# Patient Record
Sex: Male | Born: 1965 | Race: White | Hispanic: No | Marital: Married | Smoking: Former smoker
Health system: Southern US, Community
[De-identification: ages and names within clinical notes are randomized; demographics above are authoritative.]

## PROBLEM LIST (undated history)

## (undated) DIAGNOSIS — S060X9A Concussion with loss of consciousness of unspecified duration, initial encounter: Secondary | ICD-10-CM

## (undated) DIAGNOSIS — J189 Pneumonia, unspecified organism: Secondary | ICD-10-CM

## (undated) DIAGNOSIS — G4730 Sleep apnea, unspecified: Secondary | ICD-10-CM

## (undated) DIAGNOSIS — Z87442 Personal history of urinary calculi: Secondary | ICD-10-CM

## (undated) DIAGNOSIS — R519 Headache, unspecified: Secondary | ICD-10-CM

## (undated) DIAGNOSIS — G709 Myoneural disorder, unspecified: Secondary | ICD-10-CM

## (undated) DIAGNOSIS — E785 Hyperlipidemia, unspecified: Secondary | ICD-10-CM

## (undated) DIAGNOSIS — T7840XA Allergy, unspecified, initial encounter: Secondary | ICD-10-CM

## (undated) DIAGNOSIS — M1990 Unspecified osteoarthritis, unspecified site: Secondary | ICD-10-CM

## (undated) DIAGNOSIS — R51 Headache: Secondary | ICD-10-CM

## (undated) DIAGNOSIS — S060XAA Concussion with loss of consciousness status unknown, initial encounter: Secondary | ICD-10-CM

## (undated) HISTORY — PX: CHOLECYSTECTOMY: SHX55

## (undated) HISTORY — PX: KNEE SURGERY: SHX244

## (undated) HISTORY — DX: Allergy, unspecified, initial encounter: T78.40XA

## (undated) HISTORY — DX: Hyperlipidemia, unspecified: E78.5

## (undated) HISTORY — PX: KIDNEY STONE SURGERY: SHX686

## (undated) HISTORY — PX: BACK SURGERY: SHX140

---

## 2016-08-02 ENCOUNTER — Ambulatory Visit (INDEPENDENT_AMBULATORY_CARE_PROVIDER_SITE_OTHER): Payer: BLUE CROSS/BLUE SHIELD | Admitting: Orthopaedic Surgery

## 2016-08-02 ENCOUNTER — Ambulatory Visit (INDEPENDENT_AMBULATORY_CARE_PROVIDER_SITE_OTHER): Payer: Self-pay

## 2016-08-02 DIAGNOSIS — M1711 Unilateral primary osteoarthritis, right knee: Secondary | ICD-10-CM | POA: Diagnosis not present

## 2016-08-02 DIAGNOSIS — M25561 Pain in right knee: Secondary | ICD-10-CM

## 2016-08-02 DIAGNOSIS — G8929 Other chronic pain: Secondary | ICD-10-CM | POA: Diagnosis not present

## 2016-08-02 NOTE — Progress Notes (Signed)
Office Visit Note   Patient: Jeremy Vasquez           Date of Birth: 1965-08-07           MRN: [id number] Visit Date: 08/02/2016              Requested by: No referring provider defined for this encounter. PCP: No primary care provider on file.   Assessment & Plan: Visit Diagnoses:  1. Chronic pain of right knee   2. Unilateral primary osteoarthritis, right knee     Plan: I went over his x-rays in detail as well as a knee model as well as a knee replacement model. We talked about knee replacement surgery for him. This is a long and thorough discussion in this revolved around the anatomy of the knee as well as his weight and the function of his knee. His pain is 10 out of 10 and is detrimentally affects his activities daily living, his quality of life, and his mobility. He is tried and failed all forms of conservative treatment including a knee arthroscopy. He understands the risk of the surgery involves the risk of acute blood loss anemia, nerve and vessel injury, fracture, infection, and DVT. He understands all these are heightened including failure of the implants given his weight. I do feel the goals would be decreased pain, improve mobility, and improved quality of life. All questions were encouraged and answered. I do feel comfortable performing the surgery based on his physical exam of his knee. We will work on getting this scheduled in the near future. We'll see him back in 2 weeks postoperative but no x-rays of be needed.  Follow-Up Instructions: Return for 2 weeks post-op.   Orders:  Orders Placed This Encounter  Procedures  . XR Knee 1-2 Views Right   No orders of the defined types were placed in this encounter.     Procedures: No procedures performed   Clinical Data: No additional findings.   Subjective: No chief complaint on file. The patient is a very pleasant 51 year old gentleman comes in with chief complaint of right knee pain. He came to me for evaluation for  knee replacement surgery. He's been turned down by other laces due to a body mass index of greater than 40. However he said no one has really examined his knee and looked at his body habitus in general. At this point is right knee pain is daily. It is detrimentally affects his activities daily living, his quality of life, and his mobility. It is 10 out of 10. He has been to a physician in [place] a total knee needed a knee replacement as well as 1 at [facility] who said the same thing. He has already had an arthroscopic intervention of his right knee and is failed steroid injections as well as hyaluronic acid injection, NSAIDs, and activity modification.  HPI  Review of Systems He currently denies any chest pain, headache, shortness of breath, fever, chills, nausea, vomiting.  Objective: Vital Signs: There were no vitals taken for this visit.  Physical Exam He is alert and oriented 3 and in no acute distress Ortho Exam Examination of his right knee shows well-healed arthroscopic portal incisions. He has no significant effusion but he has a varus deformity that is easily correctable. His Lachman's is negative. His McMurray's is positive medial side. He has significant medial joint line tenderness as well as patellofemoral crepitation. His neck exam is normal, his lungs fluoroscopy relation bilaterally, his heart shows regular rate  and rhythm, his abdominal exam is benign. Of note his right knee has excellent range of motion but is quite painful. Specialty Comments:  No specialty comments available.  Imaging: Xr Knee 1-2 Views Right  Result Date: 08/02/2016 2 views of his right knee shows moderate to severe tricompartmental arthritic changes. There is a slight varus malalignment of the knee. There is significant para-articular osteophytes throughout the knee. This involves mainly the patellofemoral joint as well as the medial joint line. The medial joint line as close to bone on bone.    PMFS  History: There are no active problems to display for this patient.  No past medical history on file.  No family history on file.  No past surgical history on file. Social History   Occupational History  . Not on file.   Social History Main Topics  . Smoking status: Not on file  . Smokeless tobacco: Not on file  . Alcohol use Not on file  . Drug use: Unknown  . Sexual activity: Not on file

## 2016-09-04 NOTE — Progress Notes (Signed)
Need orders in epic.  Surgery on 4/20.  preop on 09/07/16.

## 2016-09-05 ENCOUNTER — Other Ambulatory Visit (INDEPENDENT_AMBULATORY_CARE_PROVIDER_SITE_OTHER): Payer: Self-pay | Admitting: Physician Assistant

## 2016-09-05 NOTE — Patient Instructions (Addendum)
Jeremy Vasquez  09/05/2016   Your procedure is scheduled on: 09/15/16  Report to [facility] Main  Entrance Take [facility]  elevators to 3rd floor to  [place] at     935 AM.     Call this number if you have problems the morning of surgery [phone]    Remember: ONLY 1 PERSON MAY GO WITH YOU TO SHORT STAY TO GET  READY MORNING OF Jeremy Vasquez.  Do not eat food or drink liquids :After Midnight.     Take these medicines the morning of surgery with A SIP OF WATER:  Gabapentin                                You may not have any metal on your body including hair pins and              piercings  Do not wear jewelry,lotions, powders or perfumes, deodorant                       Men may shave face and neck.   Do not bring valuables to the hospital. [place].  Contacts, dentures or bridgework may not be worn into surgery.  Leave suitcase in the car. After surgery it may be brought to your room.                Please read over the following fact sheets you were given: _____________________________________________________________________             [facility] - Preparing for Surgery Before surgery, you can play an important role.  Because skin is not sterile, your skin needs to be as free of germs as possible.  You can reduce the number of germs on your skin by washing with CHG (chlorahexidine gluconate) soap before surgery.  CHG is an antiseptic cleaner which kills germs and bonds with the skin to continue killing germs even after washing. Please DO NOT use if you have an allergy to CHG or antibacterial soaps.  If your skin becomes reddened/irritated stop using the CHG and inform your nurse when you arrive at Short Stay. Do not shave (including legs and underarms) for at least 48 hours prior to the first CHG shower.  You may shave your face/neck. Please follow these instructions carefully:  1.  Shower with CHG  Soap the night before surgery and the  morning of Surgery.  2.  If you choose to wash your hair, wash your hair first as usual with your  normal  shampoo.  3.  After you shampoo, rinse your hair and body thoroughly to remove the  shampoo.                           4.  Use CHG as you would any other liquid soap.  You can apply chg directly  to the skin and wash                       Gently with a scrungie or clean washcloth.  5.  Apply the CHG Soap to your body ONLY FROM THE NECK DOWN.   Do not use on  face/ open                           Wound or open sores. Avoid contact with eyes, ears mouth and genitals (private parts).                       Wash face,  Genitals (private parts) with your normal soap.             6.  Wash thoroughly, paying special attention to the area where your surgery  will be performed.  7.  Thoroughly rinse your body with warm water from the neck down.  8.  DO NOT shower/wash with your normal soap after using and rinsing off  the CHG Soap.                9.  Pat yourself dry with a clean towel.            10.  Wear clean pajamas.            11.  Place clean sheets on your bed the night of your first shower and do not  sleep with pets. Day of Surgery : Do not apply any lotions/deodorants the morning of surgery.  Please wear clean clothes to the hospital/surgery center.  FAILURE TO FOLLOW THESE INSTRUCTIONS MAY RESULT IN THE CANCELLATION OF YOUR SURGERY PATIENT SIGNATURE_________________________________  NURSE SIGNATURE__________________________________  ________________________________________________________________________   [name]  An incentive spirometer is a tool that can help keep your lungs clear and active. This tool measures how well you are filling your lungs with each breath. Taking long deep breaths may help reverse or decrease the chance of developing breathing (pulmonary) problems (especially infection) following:  A long period of time  when you are unable to move or be active. BEFORE THE PROCEDURE   If the spirometer includes an indicator to show your best effort, your nurse or respiratory therapist will set it to a desired goal.  If possible, sit up straight or lean slightly forward. Try not to slouch.  Hold the incentive spirometer in an upright position. INSTRUCTIONS FOR USE  1. Sit on the edge of your bed if possible, or sit up as far as you can in bed or on a chair. 2. Hold the incentive spirometer in an upright position. 3. Breathe out normally. 4. Place the mouthpiece in your mouth and seal your lips tightly around it. 5. Breathe in slowly and as deeply as possible, raising the piston or the ball toward the top of the column. 6. Hold your breath for 3-5 seconds or for as long as possible. Allow the piston or ball to fall to the bottom of the column. 7. Remove the mouthpiece from your mouth and breathe out normally. 8. Rest for a few seconds and repeat Steps 1 through 7 at least 10 times every 1-2 hours when you are awake. Take your time and take a few normal breaths between deep breaths. 9. The spirometer may include an indicator to show your best effort. Use the indicator as a goal to work toward during each repetition. 10. After each set of 10 deep breaths, practice coughing to be sure your lungs are clear. If you have an incision (the cut made at the time of surgery), support your incision when coughing by placing a pillow or rolled up towels firmly against it. Once you are able to get out of bed, walk around indoors  and cough well. You may stop using the incentive spirometer when instructed by your caregiver.  RISKS AND COMPLICATIONS  Take your time so you do not get dizzy or light-headed.  If you are in pain, you may need to take or ask for pain medication before doing incentive spirometry. It is harder to take a deep breath if you are having pain. AFTER USE  Rest and breathe slowly and easily.  It can be  helpful to keep track of a log of your progress. Your caregiver can provide you with a simple table to help with this. If you are using the spirometer at home, follow these instructions: [place] IF:   You are having difficultly using the spirometer.  You have trouble using the spirometer as often as instructed.  Your pain medication is not giving enough relief while using the spirometer.  You develop fever of 100.5 F (38.1 C) or higher. SEEK IMMEDIATE MEDICAL CARE IF:   You cough up bloody sputum that had not been present before.  You develop fever of 102 F (38.9 C) or greater.  You develop worsening pain at or near the incision site. MAKE SURE YOU:   Understand these instructions.  Will watch your condition.  Will get help right away if you are not doing well or get worse. Document Released: 09/25/2006 Document Revised: 08/07/2011 Document Reviewed: 11/26/2006 [facility] Patient Information 2014 [place], [place].   ________________________________________________________________________

## 2016-09-07 ENCOUNTER — Encounter (HOSPITAL_COMMUNITY): Payer: Self-pay

## 2016-09-07 ENCOUNTER — Encounter (HOSPITAL_COMMUNITY)
Admission: RE | Admit: 2016-09-07 | Discharge: 2016-09-07 | Disposition: A | Discharge status: Home / Self Care | Payer: BLUE CROSS/BLUE SHIELD | Source: Ambulatory Visit | Attending: Orthopaedic Surgery | Admitting: Orthopaedic Surgery

## 2016-09-07 DIAGNOSIS — M1711 Unilateral primary osteoarthritis, right knee: Secondary | ICD-10-CM | POA: Insufficient documentation

## 2016-09-07 DIAGNOSIS — Z01818 Encounter for other preprocedural examination: Secondary | ICD-10-CM | POA: Insufficient documentation

## 2016-09-07 HISTORY — DX: Concussion with loss of consciousness status unknown, initial encounter: S06.0XAA

## 2016-09-07 HISTORY — DX: Personal history of urinary calculi: Z87.442

## 2016-09-07 HISTORY — DX: Unspecified osteoarthritis, unspecified site: M19.90

## 2016-09-07 HISTORY — DX: Pneumonia, unspecified organism: J18.9

## 2016-09-07 HISTORY — DX: Headache, unspecified: R51.9

## 2016-09-07 HISTORY — DX: Myoneural disorder, unspecified: G70.9

## 2016-09-07 HISTORY — DX: Concussion with loss of consciousness of unspecified duration, initial encounter: S06.0X9A

## 2016-09-07 HISTORY — DX: Sleep apnea, unspecified: G47.30

## 2016-09-07 HISTORY — DX: Headache: R51

## 2016-09-07 LAB — CBC
HCT: 44 % (ref 39.0–52.0)
Hemoglobin: 14.4 g/dL (ref 13.0–17.0)
MCH: 27.6 pg (ref 26.0–34.0)
MCHC: 32.7 g/dL (ref 30.0–36.0)
MCV: 84.3 fL (ref 78.0–100.0)
PLATELETS: 200 10*3/uL (ref 150–400)
RBC: 5.22 MIL/uL (ref 4.22–5.81)
RDW: 14.6 % (ref 11.5–15.5)
WBC: 6.9 10*3/uL (ref 4.0–10.5)

## 2016-09-07 LAB — BASIC METABOLIC PANEL
Anion gap: 7 (ref 5–15)
BUN: 22 mg/dL — AB (ref 6–20)
CHLORIDE: 105 mmol/L (ref 101–111)
CO2: 26 mmol/L (ref 22–32)
CREATININE: 1 mg/dL (ref 0.61–1.24)
Calcium: 9 mg/dL (ref 8.9–10.3)
GFR calc Af Amer: >60 mL/min (ref >60)
Glucose, Bld: 112 mg/dL — ABNORMAL HIGH (ref 65–99)
Potassium: 4.7 mmol/L (ref 3.5–5.1)
SODIUM: 138 mmol/L (ref 135–145)

## 2016-09-07 LAB — SURGICAL PCR SCREEN
MRSA, PCR: NEGATIVE
STAPHYLOCOCCUS AUREUS: NEGATIVE

## 2016-09-11 ENCOUNTER — Other Ambulatory Visit (INDEPENDENT_AMBULATORY_CARE_PROVIDER_SITE_OTHER): Payer: Self-pay | Admitting: Orthopaedic Surgery

## 2016-09-14 MED ORDER — CEFAZOLIN SODIUM 10 G IJ SOLR
3.0000 g | INTRAMUSCULAR | Status: AC
  Administered 2016-09-15: 3 g via INTRAVENOUS
  Filled 2016-09-14: qty 3

## 2016-09-15 ENCOUNTER — Inpatient Hospital Stay (HOSPITAL_COMMUNITY): Payer: BLUE CROSS/BLUE SHIELD | Admitting: Registered Nurse

## 2016-09-15 ENCOUNTER — Inpatient Hospital Stay (HOSPITAL_COMMUNITY): Payer: BLUE CROSS/BLUE SHIELD

## 2016-09-15 ENCOUNTER — Inpatient Hospital Stay (HOSPITAL_COMMUNITY)
Admission: RE | Admit: 2016-09-15 | Discharge: 2016-09-17 | DRG: 470 | Disposition: A | Discharge status: Home Health | Payer: BLUE CROSS/BLUE SHIELD | Source: Ambulatory Visit | Attending: Orthopaedic Surgery | Admitting: Orthopaedic Surgery

## 2016-09-15 ENCOUNTER — Encounter (HOSPITAL_COMMUNITY)
Admission: RE | Disposition: A | Discharge status: Home Health | Payer: Self-pay | Source: Ambulatory Visit | Attending: Orthopaedic Surgery

## 2016-09-15 ENCOUNTER — Encounter (HOSPITAL_COMMUNITY): Payer: Self-pay

## 2016-09-15 DIAGNOSIS — E669 Obesity, unspecified: Secondary | ICD-10-CM | POA: Diagnosis present

## 2016-09-15 DIAGNOSIS — M1711 Unilateral primary osteoarthritis, right knee: Secondary | ICD-10-CM | POA: Diagnosis present

## 2016-09-15 DIAGNOSIS — Z791 Long term (current) use of non-steroidal anti-inflammatories (NSAID): Secondary | ICD-10-CM

## 2016-09-15 DIAGNOSIS — Z6841 Body Mass Index (BMI) 40.0 and over, adult: Secondary | ICD-10-CM

## 2016-09-15 DIAGNOSIS — Z87891 Personal history of nicotine dependence: Secondary | ICD-10-CM

## 2016-09-15 DIAGNOSIS — Z96651 Presence of right artificial knee joint: Secondary | ICD-10-CM

## 2016-09-15 DIAGNOSIS — G4730 Sleep apnea, unspecified: Secondary | ICD-10-CM | POA: Diagnosis present

## 2016-09-15 HISTORY — PX: TOTAL KNEE ARTHROPLASTY: SHX125

## 2016-09-15 SURGERY — ARTHROPLASTY, KNEE, TOTAL
Anesthesia: General | Site: Knee | Laterality: Right

## 2016-09-15 MED ORDER — OXYCODONE HCL 5 MG PO TABS: 5.0000 mg | ORAL_TABLET | Freq: Once | ORAL | Status: DC | PRN

## 2016-09-15 MED ORDER — LIDOCAINE 2% (20 MG/ML) 5 ML SYRINGE
INTRAMUSCULAR | Status: AC
  Filled 2016-09-15: qty 10

## 2016-09-15 MED ORDER — BUPIVACAINE-EPINEPHRINE (PF) 0.5% -1:200000 IJ SOLN
INTRAMUSCULAR | Status: DC | PRN
  Administered 2016-09-15: 25 mL via PERINEURAL

## 2016-09-15 MED ORDER — SUFENTANIL CITRATE 50 MCG/ML IV SOLN
INTRAVENOUS | Status: AC
  Filled 2016-09-15: qty 1

## 2016-09-15 MED ORDER — ONDANSETRON HCL 4 MG/2ML IJ SOLN
INTRAMUSCULAR | Status: AC
  Filled 2016-09-15: qty 2

## 2016-09-15 MED ORDER — SUGAMMADEX SODIUM 500 MG/5ML IV SOLN
INTRAVENOUS | Status: AC
  Filled 2016-09-15: qty 5

## 2016-09-15 MED ORDER — DEXAMETHASONE SODIUM PHOSPHATE 10 MG/ML IJ SOLN
INTRAMUSCULAR | Status: DC | PRN
  Administered 2016-09-15: 10 mg via INTRAVENOUS

## 2016-09-15 MED ORDER — SUGAMMADEX SODIUM 500 MG/5ML IV SOLN
INTRAVENOUS | Status: DC | PRN
  Administered 2016-09-15: 300 mg via INTRAVENOUS

## 2016-09-15 MED ORDER — DIPHENHYDRAMINE HCL 12.5 MG/5ML PO ELIX
12.5000 mg | ORAL_SOLUTION | ORAL | Status: DC | PRN
  Administered 2016-09-17: 12.5 mg via ORAL
  Filled 2016-09-15: qty 5

## 2016-09-15 MED ORDER — CEFAZOLIN SODIUM-DEXTROSE 2-4 GM/100ML-% IV SOLN
2.0000 g | Freq: Four times a day (QID) | INTRAVENOUS | Status: AC
  Administered 2016-09-15 (×2): 2 g via INTRAVENOUS
  Filled 2016-09-15 (×2): qty 100

## 2016-09-15 MED ORDER — ONDANSETRON HCL 4 MG PO TABS: 4.0000 mg | ORAL_TABLET | Freq: Four times a day (QID) | ORAL | Status: DC | PRN

## 2016-09-15 MED ORDER — ACETAMINOPHEN 10 MG/ML IV SOLN
INTRAVENOUS | Status: DC | PRN
  Administered 2016-09-15: 1000 mg via INTRAVENOUS

## 2016-09-15 MED ORDER — METOCLOPRAMIDE HCL 5 MG/ML IJ SOLN: 5.0000 mg | Freq: Three times a day (TID) | INTRAMUSCULAR | Status: DC | PRN

## 2016-09-15 MED ORDER — METOCLOPRAMIDE HCL 5 MG PO TABS
5.0000 mg | ORAL_TABLET | Freq: Three times a day (TID) | ORAL | Status: DC | PRN
Start: 2016-09-15 — End: 2016-09-17

## 2016-09-15 MED ORDER — METHOCARBAMOL 1000 MG/10ML IJ SOLN
500.0000 mg | Freq: Four times a day (QID) | INTRAVENOUS | Status: DC | PRN
  Administered 2016-09-15: 500 mg via INTRAVENOUS
  Filled 2016-09-15: qty 550

## 2016-09-15 MED ORDER — SODIUM CHLORIDE 0.9 % IV SOLN
INTRAVENOUS | Status: DC
  Administered 2016-09-15 – 2016-09-16 (×2): via INTRAVENOUS

## 2016-09-15 MED ORDER — PHENYLEPHRINE 40 MCG/ML (10ML) SYRINGE FOR IV PUSH (FOR BLOOD PRESSURE SUPPORT)
PREFILLED_SYRINGE | INTRAVENOUS | Status: AC
  Filled 2016-09-15: qty 20

## 2016-09-15 MED ORDER — SUCCINYLCHOLINE CHLORIDE 200 MG/10ML IV SOSY
PREFILLED_SYRINGE | INTRAVENOUS | Status: AC
  Filled 2016-09-15: qty 10

## 2016-09-15 MED ORDER — PHENYLEPHRINE HCL 10 MG/ML IJ SOLN
INTRAMUSCULAR | Status: DC | PRN
  Administered 2016-09-15: 80 ug via INTRAVENOUS
  Administered 2016-09-15 (×3): 120 ug via INTRAVENOUS

## 2016-09-15 MED ORDER — ONDANSETRON HCL 4 MG/2ML IJ SOLN
INTRAMUSCULAR | Status: DC | PRN
  Administered 2016-09-15: 4 mg via INTRAVENOUS

## 2016-09-15 MED ORDER — FENTANYL CITRATE (PF) 100 MCG/2ML IJ SOLN
INTRAMUSCULAR | Status: AC
  Filled 2016-09-15: qty 2

## 2016-09-15 MED ORDER — EPHEDRINE 5 MG/ML INJ
INTRAVENOUS | Status: AC
  Filled 2016-09-15: qty 10

## 2016-09-15 MED ORDER — SODIUM CHLORIDE 0.9 % IR SOLN
Status: DC | PRN
  Administered 2016-09-15: 1000 mL

## 2016-09-15 MED ORDER — LIDOCAINE HCL (CARDIAC) 20 MG/ML IV SOLN
INTRAVENOUS | Status: DC | PRN
  Administered 2016-09-15: 25 mg via INTRATRACHEAL
  Administered 2016-09-15: 100 mg via INTRATRACHEAL

## 2016-09-15 MED ORDER — MENTHOL 3 MG MT LOZG: 1.0000 | LOZENGE | OROMUCOSAL | Status: DC | PRN

## 2016-09-15 MED ORDER — HYDROMORPHONE HCL 2 MG/ML IJ SOLN
0.2500 mg | INTRAMUSCULAR | Status: DC | PRN
  Administered 2016-09-15: 0.3 mg via INTRAVENOUS

## 2016-09-15 MED ORDER — PROPOFOL 10 MG/ML IV BOLUS
INTRAVENOUS | Status: DC | PRN
  Administered 2016-09-15: 250 mg via INTRAVENOUS

## 2016-09-15 MED ORDER — ONDANSETRON HCL 4 MG/2ML IJ SOLN: 4.0000 mg | Freq: Four times a day (QID) | INTRAMUSCULAR | Status: DC | PRN

## 2016-09-15 MED ORDER — CHLORHEXIDINE GLUCONATE 4 % EX LIQD: 60.0000 mL | Freq: Once | CUTANEOUS | Status: DC

## 2016-09-15 MED ORDER — LACTATED RINGERS IV SOLN
INTRAVENOUS | Status: DC | PRN
  Administered 2016-09-15 (×3): via INTRAVENOUS

## 2016-09-15 MED ORDER — SODIUM CHLORIDE 0.9 % IR SOLN
Status: DC | PRN
  Administered 2016-09-15: 2000 mL

## 2016-09-15 MED ORDER — EPHEDRINE SULFATE 50 MG/ML IJ SOLN
INTRAMUSCULAR | Status: DC | PRN
  Administered 2016-09-15: 5 mg via INTRAVENOUS

## 2016-09-15 MED ORDER — POLYETHYLENE GLYCOL 3350 17 G PO PACK: 17.0000 g | PACK | Freq: Every day | ORAL | Status: DC | PRN

## 2016-09-15 MED ORDER — MIDAZOLAM HCL 2 MG/2ML IJ SOLN
INTRAMUSCULAR | Status: AC
  Filled 2016-09-15: qty 2

## 2016-09-15 MED ORDER — MIDAZOLAM HCL 5 MG/5ML IJ SOLN
INTRAMUSCULAR | Status: DC | PRN
  Administered 2016-09-15: 2 mg via INTRAVENOUS

## 2016-09-15 MED ORDER — OXYCODONE HCL 5 MG/5ML PO SOLN
5.0000 mg | Freq: Once | ORAL | Status: DC | PRN
  Filled 2016-09-15: qty 5

## 2016-09-15 MED ORDER — HYDROMORPHONE HCL 1 MG/ML IJ SOLN
INTRAMUSCULAR | Status: DC | PRN
  Administered 2016-09-15 (×2): 1 mg via INTRAVENOUS

## 2016-09-15 MED ORDER — PROPOFOL 10 MG/ML IV BOLUS
INTRAVENOUS | Status: AC
  Filled 2016-09-15: qty 40

## 2016-09-15 MED ORDER — HYDROMORPHONE HCL 2 MG/ML IJ SOLN: 1.0000 mg | INTRAMUSCULAR | Status: DC | PRN

## 2016-09-15 MED ORDER — RIVAROXABAN 10 MG PO TABS
10.0000 mg | ORAL_TABLET | Freq: Every day | ORAL | Status: DC
  Administered 2016-09-16 – 2016-09-17 (×2): 10 mg via ORAL
  Filled 2016-09-15 (×2): qty 1

## 2016-09-15 MED ORDER — DEXAMETHASONE SODIUM PHOSPHATE 10 MG/ML IJ SOLN
INTRAMUSCULAR | Status: AC
  Filled 2016-09-15: qty 1

## 2016-09-15 MED ORDER — ALUM & MAG HYDROXIDE-SIMETH 200-200-20 MG/5ML PO SUSP: 30.0000 mL | ORAL | Status: DC | PRN

## 2016-09-15 MED ORDER — OXYCODONE HCL 5 MG PO TABS
5.0000 mg | ORAL_TABLET | ORAL | Status: DC | PRN
  Administered 2016-09-15 – 2016-09-17 (×5): 10 mg via ORAL
  Filled 2016-09-15 (×5): qty 2

## 2016-09-15 MED ORDER — HYDROMORPHONE HCL 2 MG/ML IJ SOLN
INTRAMUSCULAR | Status: AC
  Filled 2016-09-15: qty 1

## 2016-09-15 MED ORDER — PHENOL 1.4 % MT LIQD
1.0000 | OROMUCOSAL | Status: DC | PRN
  Filled 2016-09-15: qty 177

## 2016-09-15 MED ORDER — ROCURONIUM BROMIDE 50 MG/5ML IV SOSY
PREFILLED_SYRINGE | INTRAVENOUS | Status: AC
  Filled 2016-09-15: qty 5

## 2016-09-15 MED ORDER — ACETAMINOPHEN 10 MG/ML IV SOLN
INTRAVENOUS | Status: AC
  Filled 2016-09-15: qty 100

## 2016-09-15 MED ORDER — FENTANYL CITRATE (PF) 100 MCG/2ML IJ SOLN
INTRAMUSCULAR | Status: DC | PRN
  Administered 2016-09-15: 100 ug via INTRAVENOUS

## 2016-09-15 MED ORDER — STERILE WATER FOR IRRIGATION IR SOLN
Status: DC | PRN
  Administered 2016-09-15: 2000 mL

## 2016-09-15 MED ORDER — ACETAMINOPHEN 325 MG PO TABS
650.0000 mg | ORAL_TABLET | Freq: Four times a day (QID) | ORAL | Status: DC | PRN
  Administered 2016-09-16: 650 mg via ORAL
  Filled 2016-09-15: qty 2

## 2016-09-15 MED ORDER — DOCUSATE SODIUM 100 MG PO CAPS
100.0000 mg | ORAL_CAPSULE | Freq: Two times a day (BID) | ORAL | Status: DC
  Administered 2016-09-16 – 2016-09-17 (×4): 100 mg via ORAL
  Filled 2016-09-15 (×4): qty 1

## 2016-09-15 MED ORDER — ACETAMINOPHEN 650 MG RE SUPP: 650.0000 mg | Freq: Four times a day (QID) | RECTAL | Status: DC | PRN

## 2016-09-15 MED ORDER — METHOCARBAMOL 500 MG PO TABS
500.0000 mg | ORAL_TABLET | Freq: Four times a day (QID) | ORAL | Status: DC | PRN
  Administered 2016-09-16 – 2016-09-17 (×3): 500 mg via ORAL
  Filled 2016-09-15 (×3): qty 1

## 2016-09-15 MED ORDER — SUFENTANIL CITRATE 50 MCG/ML IV SOLN
INTRAVENOUS | Status: DC | PRN
  Administered 2016-09-15: 5 ug via INTRAVENOUS
  Administered 2016-09-15: 15 ug via INTRAVENOUS
  Administered 2016-09-15: 10 ug via INTRAVENOUS
  Administered 2016-09-15 (×2): 15 ug via INTRAVENOUS

## 2016-09-15 MED ORDER — SODIUM CHLORIDE 0.9 % IV SOLN
1000.0000 mg | INTRAVENOUS | Status: AC
  Administered 2016-09-15: 1000 mg via INTRAVENOUS
  Filled 2016-09-15: qty 1100

## 2016-09-15 MED ORDER — KETOROLAC TROMETHAMINE 15 MG/ML IJ SOLN
15.0000 mg | Freq: Four times a day (QID) | INTRAMUSCULAR | Status: AC
  Administered 2016-09-15 – 2016-09-16 (×4): 15 mg via INTRAVENOUS
  Filled 2016-09-15 (×4): qty 1

## 2016-09-15 MED ORDER — GABAPENTIN 300 MG PO CAPS
300.0000 mg | ORAL_CAPSULE | Freq: Two times a day (BID) | ORAL | Status: DC
  Administered 2016-09-16 – 2016-09-17 (×4): 300 mg via ORAL
  Filled 2016-09-15 (×4): qty 1

## 2016-09-15 SURGICAL SUPPLY — 44 items
BAG ZIPLOCK 12X15 (MISCELLANEOUS) ×2 IMPLANT
BANDAGE ACE 6X5 VEL STRL LF (GAUZE/BANDAGES/DRESSINGS) ×2 IMPLANT
BENZOIN TINCTURE PRP APPL 2/3 (GAUZE/BANDAGES/DRESSINGS) ×2 IMPLANT
BLADE SAG 18X100X1.27 (BLADE) ×2 IMPLANT
BOWL SMART MIX CTS (DISPOSABLE) ×2 IMPLANT
CAPT KNEE TOTAL 3 ×2 IMPLANT
CEMENT BONE SIMPLEX SPEEDSET (Cement) ×4 IMPLANT
COVER SURGICAL LIGHT HANDLE (MISCELLANEOUS) ×2 IMPLANT
CUFF TOURN SGL QUICK 44 (TOURNIQUET CUFF) ×2 IMPLANT
DRAPE U-SHAPE 47X51 STRL (DRAPES) ×2 IMPLANT
DRSG PAD ABDOMINAL 8X10 ST (GAUZE/BANDAGES/DRESSINGS) ×2 IMPLANT
DURAPREP 26ML APPLICATOR (WOUND CARE) ×2 IMPLANT
ELECT REM PT RETURN 15FT ADLT (MISCELLANEOUS) ×2 IMPLANT
GAUZE SPONGE 4X4 12PLY STRL (GAUZE/BANDAGES/DRESSINGS) ×2 IMPLANT
GLOVE BIO SURGEON STRL SZ7.5 (GLOVE) ×2 IMPLANT
GLOVE BIOGEL PI IND STRL 7.5 (GLOVE) ×5 IMPLANT
GLOVE BIOGEL PI IND STRL 8 (GLOVE) ×2 IMPLANT
GLOVE BIOGEL PI INDICATOR 7.5 (GLOVE) ×5
GLOVE BIOGEL PI INDICATOR 8 (GLOVE) ×2
GLOVE ECLIPSE 8.0 STRL XLNG CF (GLOVE) ×2 IMPLANT
GLOVE SURG SS PI 7.5 STRL IVOR (GLOVE) ×2 IMPLANT
GOWN STRL REUS W/ TWL XL LVL3 (GOWN DISPOSABLE) ×1 IMPLANT
GOWN STRL REUS W/TWL XL LVL3 (GOWN DISPOSABLE) ×7 IMPLANT
HANDPIECE INTERPULSE COAX TIP (DISPOSABLE) ×1
IMMOBILIZER KNEE 20 (SOFTGOODS) ×4
IMMOBILIZER KNEE 20 THIGH 36 (SOFTGOODS) ×2 IMPLANT
PACK TOTAL KNEE CUSTOM (KITS) ×2 IMPLANT
PAD ABD 8X10 STRL (GAUZE/BANDAGES/DRESSINGS) ×2 IMPLANT
PADDING CAST COTTON 6X4 STRL (CAST SUPPLIES) ×4 IMPLANT
POSITIONER SURGICAL ARM (MISCELLANEOUS) ×2 IMPLANT
SET HNDPC FAN SPRY TIP SCT (DISPOSABLE) ×1 IMPLANT
SET PAD KNEE POSITIONER (MISCELLANEOUS) ×2 IMPLANT
STAPLER VISISTAT 35W (STAPLE) IMPLANT
STRIP CLOSURE SKIN 1/2X4 (GAUZE/BANDAGES/DRESSINGS) ×4 IMPLANT
SUT MNCRL AB 4-0 PS2 18 (SUTURE) IMPLANT
SUT VIC AB 0 CT1 27 (SUTURE) ×1
SUT VIC AB 0 CT1 27XBRD ANTBC (SUTURE) ×1 IMPLANT
SUT VIC AB 1 CT1 27 (SUTURE) ×2
SUT VIC AB 1 CT1 27XBRD ANTBC (SUTURE) ×2 IMPLANT
SUT VIC AB 2-0 CT1 27 (SUTURE) ×2
SUT VIC AB 2-0 CT1 TAPERPNT 27 (SUTURE) ×2 IMPLANT
WRAP KNEE MAXI GEL POST OP (GAUZE/BANDAGES/DRESSINGS) ×2 IMPLANT
YANKAUER SUCT BULB TIP 10FT TU (MISCELLANEOUS) ×2 IMPLANT
YANKAUER SUCT BULB TIP NO VENT (SUCTIONS) ×2 IMPLANT

## 2016-09-15 NOTE — Anesthesia Procedure Notes (Signed)
Anesthesia Regional Block: Adductor canal block   Pre-Anesthetic Checklist: ,, timeout performed, Correct Patient, Correct Site, Correct Laterality, Correct Procedure, Correct Position, site marked, Risks and benefits discussed,  Surgical consent,  Pre-op evaluation,  At surgeon's request and post-op pain management  Laterality: Right  Prep: chloraprep       Needles:  Injection technique: Single-shot  Needle Type: Echogenic Needle     Needle Length: 9cm  Needle Gauge: 21     Additional Needles:   Procedures: ultrasound guided,,,,,,,,  Narrative:  Start time: 09/15/2016 7:09 AM End time: 09/15/2016 7:16 AM Injection made incrementally with aspirations every 5 mL.  Performed by: Personally  Anesthesiologist: Jimma Ortman  Additional Notes: Pt tolerated the procedure well.

## 2016-09-15 NOTE — Progress Notes (Signed)
Pt states that he will self administer CPAP when ready for bed.  RT to monitor and assess as needed.  

## 2016-09-15 NOTE — Anesthesia Procedure Notes (Addendum)
Procedure Name: Intubation Date/Time: 09/15/2016 7:43 AM Performed by: Lissa Morales Pre-anesthesia Checklist: Patient identified, Emergency Drugs available, Suction available and Patient being monitored Patient Re-evaluated:Patient Re-evaluated prior to inductionOxygen Delivery Method: Circle system utilized Preoxygenation: Pre-oxygenation with 100% oxygen Intubation Type: IV induction Ventilation: Mask ventilation without difficulty Laryngoscope Size: Glidescope, Mac and 4 Grade View: Grade IV Tube type: Oral Tube size: 8.0 mm Number of attempts: 1 Airway Equipment and Method: Stylet and Oral airway Placement Confirmation: ETT inserted through vocal cords under direct vision,  positive ETCO2 and breath sounds checked- equal and bilateral Secured at: 23 cm Tube secured with: Tape Dental Injury: Teeth and Oropharynx as per pre-operative assessment  Difficulty Due To: Difficulty was anticipated, Difficult Airway- due to large tongue, Difficult Airway- due to reduced neck mobility and Difficult Airway- due to limited oral opening Comments: Pt obese and lots of redundant tissue, small mouth see above.  Biltateral breath sounds and +ETCO2. MAP 4**
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
## 2016-09-15 NOTE — Op Note (Signed)
NAME:  Jeremy Vasquez, Jeremy Vasquez NO.:  [id number]  MEDICAL RECORD NO.:  [id number]  LOCATION:                               FACILITY:  [facility]  PHYSICIAN:  Lind Guest. Ninfa Linden, M.D.DATE OF BIRTH:  May 28, 1966  DATE OF PROCEDURE:  09/15/2016 DATE OF DISCHARGE:                              OPERATIVE REPORT   PREOPERATIVE DIAGNOSIS:  Severe osteoarthritis and degenerative joint disease, right knee.  POSTOPERATIVE DIAGNOSIS:  Severe osteoarthritis and degenerative joint disease, right knee.  PROCEDURE:  Right total knee arthroplasty.  IMPLANTS:  Stryker Triathlon knee with size 5 femoral component, size 5 tibial tray with universal base plate, size 11 mm thickness polyethylene insert, size 35 patellar button.  SURGEON:  Lind Guest. Ninfa Linden, M.D.  ASSISTANT:  Erskine Emery, PA-C.  ANESTHESIA: 1. Right lower extremity adductor canal block. 2. General.  TOURNIQUET TIME:  Just over 1 hour.  BLOOD LOSS:  Less than 100 mL.  COMPLICATIONS:  None.  ANTIBIOTICS:  IV Ancef 3 g.  INDICATIONS:  Mr. Peddie is a 51 year old gentleman well known to me.  He has a history of a right knee arthroscopy with a partial medial meniscectomy.  Over the years, his arthritis has worsened and his knee pain is worsened in his right knee.  He now has x-ray showing significant cartilage loss throughout the knee.  His pain is daily and it has detrimentally affected his activities of daily living, his mobility, and his quality of life.  At this point, given the failure of conservative treatment, he wished to proceed with a total knee arthroplasty.  He understands the risk of acute blood loss anemia, nerve and vessel injury, fracture, infection, and DVT.  He understands the goals of decreased pain, improved mobility, and overall improved quality of life.  PROCEDURE DESCRIPTION:  After informed consent was obtained, appropriate right knee was marked.  Anesthesia obtained an adductor canal  block.  He was brought to the operating room, placed supine on the operating table. General anesthesia was then obtained.  A nonsterile tourniquet was placed around his upper right thigh, and his right leg was prepped and draped from the thigh down the ankle with DuraPrep and sterile drapes and a sterile stockinette.  A time-out was called.  He identified correct patient and correct right knee.  We then used an Esmarch to wrap out the leg and tourniquet was inflated to 300 mmHg.  We then made a direct midline incision over the patella and carried this proximally and distally.  We dissected down the knee joint, carried out a medial parapatellar arthrotomy finding a large joint effusion and finding significant arthritis throughout the knee.  With the knee in a flexed position using the extramedullary cutting guide, we set this for taking 9 mm off the high side of the tibia correcting varus, valgus, and neutral slope.  We made our tibia cut without difficulty.  We then went to the femur and set our distal femoral cutting guide for 5 degrees, externally rotated for right knee setting our distal femoral cutting guide for 8 mm distal femoral cut.  We made this cut without difficulty and brought the knee back down to  full extension with a 9 mm extension block and achieved its full extension.  We went back to the femur and cleaned more soft tissue from the knee including more remnants of the ACL, PCL, medial and lateral meniscus.  We then put our femoral sizing guide based off the epicondylar axis and Whiteside's line, also based on 3 degrees externally rotated.  We made our sizing decision after using a size 5 femur.  We then put our 4-in-1 cutting block for a size 5 femur, made our anterior and posterior cuts, followed by our chamfer cuts.  We then made our femoral box cut.  We then went back to the tibia and set our tibial rotation off the tibial tubercle and the femur.  We chose a size 5  tibial tray, so once we secured this, we drilled for universal base plate and did our keel punch.  With a trial 5 tibia in place, followed by the 5 femur, we tried a 9 mm and 11 mm fix-bearing polyethylene insert and we were pleased with 11 mm stability and range of motion.  We then made our patellar cut and drilled 3 holes for patellar button.  We then removed all trial instrumentation and irrigated the knee with normal saline solution.  We then mixed our cement and cemented the real Stryker Triathlon tibial tray with universal base plate size 5, followed by the size 5 femur.  We placed the 11 mm fix-bearing polyethylene insert and cemented our patellar button.  Once the cement had hardened, we removed cement debris from the knee and then irrigated the knee with normal saline solution.  The tourniquet was let down and hemostasis was obtained with electrocautery. We then closed the arthrotomy with interrupted #1 Vicryl suture, followed by 0 Vicryl in the deep tissue, 2-0 Vicryl in the subcutaneous tissue, 4-0 Monocryl subcuticular stitch, and Steri-Strips on the skin. Well-padded sterile dressing was applied.  He was awakened, extubated, and taken to recovery room in stable condition.  All final counts were correct.  There were no complications noted.  Of note, Erskine Emery, PA-C assisted in the entire case.  His assistance was crucial for facilitating all aspects of this case.     Lind Guest. Ninfa Linden, M.D.   ______________________________ Lind Guest. Ninfa Linden, M.D.    CYB/MEDQ  D:  09/15/2016  T:  09/15/2016  Job:  [id number]

## 2016-09-15 NOTE — Transfer of Care (Signed)
Immediate Anesthesia Transfer of Care Note  Patient: Jeremy Vasquez  Procedure(s) Performed: Procedure(s) with comments: RIGHT TOTAL KNEE ARTHROPLASTY (Right) - Adductor Block  Patient Location: PACU  Anesthesia Type:General  Level of Consciousness: awake, alert , oriented and patient cooperative  Airway & Oxygen Therapy: Patient Spontanous Breathing and Patient connected to face mask oxygen  Post-op Assessment: Report given to RN, Post -op Vital signs reviewed and stable and Patient moving all extremities X 4  Post vital signs: stable  Last Vitals:  Vitals:   09/15/16 0518 09/15/16 0945  BP: 133/83 (!) (P) 162/79  Pulse: 79 (P) 88  Resp: 16 (!) (P) 28  Temp: 36.4 C (P) 36.5 C    Last Pain:  Vitals:   09/15/16 0549  TempSrc:   PainSc: 2       Patients Stated Pain Goal: 2 (99/23/41 4436)  Complications: No apparent anesthesia complications

## 2016-09-15 NOTE — Evaluation (Signed)
Physical Therapy Evaluation Patient Details Name: Jeremy Vasquez MRN: [id number] DOB: 1965-11-16 Today's Date: 09/15/2016   History of Present Illness  Pt s/p R TKR and with hx of multiple back surgeries  Clinical Impression  Pt s/p R TKR and presents with decreased R LE strength/ROM and post op pain limiting functional mobility.  Pt should progress to dc home with family assist and HHPT follow up.    Follow Up Recommendations Home health PT    Equipment Recommendations  Rolling walker with 5" wheels (Pt is 45ft tall and 330+ lbs)    Recommendations for Other Services       Precautions / Restrictions Precautions Precautions: Fall;Knee Required Braces or Orthoses: Knee Immobilizer - Right Knee Immobilizer - Right: Discontinue once straight leg raise with < 10 degree lag Restrictions Weight Bearing Restrictions: No Other Position/Activity Restrictions: WBAT      Mobility  Bed Mobility Overal bed mobility: Needs Assistance Bed Mobility: Supine to Sit     Supine to sit: Min assist     General bed mobility comments: cues for sequence and use of L LE to self assist.  Physical assist with R LE  Transfers Overall transfer level: Needs assistance Equipment used: Rolling walker (2 wheeled) Transfers: Sit to/from Stand Sit to Stand: Min assist;From elevated surface         General transfer comment: cues for LE management and use of UEs to self assist  Ambulation/Gait Ambulation/Gait assistance: Min assist Ambulation Distance (Feet): 14 Feet Assistive device: Rolling walker (2 wheeled) Gait Pattern/deviations: Step-to pattern;Decreased step length - right;Decreased step length - left;Shuffle;Trunk flexed Gait velocity: decr   General Gait Details: cues for sequence, posture and position from [employer]            Wheelchair Mobility    Modified Rankin (Stroke Patients Only)       Balance                                              Pertinent Vitals/Pain Pain Assessment: Faces Faces Pain Scale: Hurts even more Pain Location: R knee with WB or movement Pain Descriptors / Indicators: Aching;Sore Pain Intervention(s): Limited activity within patient's tolerance;Monitored during session;Premedicated before session;Ice applied    Home Living Family/patient expects to be discharged to:: Private residence Living Arrangements: Spouse/significant other;Children Available Help at Discharge: Family Type of Home: House Home Access: Stairs to enter   Technical brewer of Steps: 1 Home Layout: One level Home Equipment: Crutches      Prior Function Level of Independence: Independent;Independent with assistive device(s)   Gait / Transfers Assistance Needed: crutches as needed           Hand Dominance        Extremity/Trunk Assessment   Upper Extremity Assessment Upper Extremity Assessment: Overall WFL for tasks assessed    Lower Extremity Assessment Lower Extremity Assessment: RLE deficits/detail       Communication   Communication: No difficulties  Cognition Arousal/Alertness: Awake/alert Behavior During Therapy: WFL for tasks assessed/performed Overall Cognitive Status: Within Functional Limits for tasks assessed                                        General Comments      Exercises     Assessment/Plan  PT Assessment Patient needs continued PT services  PT Problem List Decreased strength;Decreased range of motion;Decreased activity tolerance;Decreased mobility;Decreased knowledge of use of DME;Obesity;Pain       PT Treatment Interventions DME instruction;Gait training;Stair training;Functional mobility training;Therapeutic exercise;Therapeutic activities;Patient/family education    PT Goals (Current goals can be found in the Care Plan section)  Acute Rehab PT Goals Patient Stated Goal: go fishing PT Goal Formulation: With patient Time For Goal Achievement:  09/19/16 Potential to Achieve Goals: Good    Frequency 7X/week   Barriers to discharge        Co-evaluation               End of Session Equipment Utilized During Treatment: Right knee immobilizer Activity Tolerance: Patient tolerated treatment well Patient left: in chair;with call bell/phone within reach;with family/visitor present Nurse Communication: Mobility status PT Visit Diagnosis: Unsteadiness on feet (R26.81);Difficulty in walking, not elsewhere classified (R26.2)    Time: 7195-9747 PT Time Calculation (min) (ACUTE ONLY): 45 min   Charges:   PT Evaluation $PT Eval Low Complexity: 1 Procedure PT Treatments $Gait Training: 8-22 mins   PT G Codes:        Pg [phone]   Jeremy Vasquez 09/15/2016, 3:56 PM

## 2016-09-15 NOTE — Anesthesia Preprocedure Evaluation (Addendum)
Anesthesia Evaluation  Patient identified by MRN, date of birth, ID band Patient awake    Reviewed: Allergy & Precautions, H&P , NPO status , Patient's Chart, lab work & pertinent test results  Airway Mallampati: II   Neck ROM: full    Dental   Pulmonary sleep apnea , former smoker,    breath sounds clear to auscultation       Cardiovascular negative cardio ROS   Rhythm:regular Rate:Normal     Neuro/Psych  Headaches,    GI/Hepatic   Endo/Other    Renal/GU      Musculoskeletal  (+) Arthritis ,   Abdominal   Peds  Hematology   Anesthesia Other Findings   Reproductive/Obstetrics                             Anesthesia Physical Anesthesia Plan  ASA: II  Anesthesia Plan: General   Post-op Pain Management: GA combined w/ Regional for post-op pain   Induction: Intravenous  Airway Management Planned: Simple Face Mask  Additional Equipment:   Intra-op Plan:   Post-operative Plan: Extubation in OR  Informed Consent: I have reviewed the patients History and Physical, chart, labs and discussed the procedure including the risks, benefits and alternatives for the proposed anesthesia with the patient or authorized representative who has indicated his/her understanding and acceptance.     Plan Discussed with: CRNA, Anesthesiologist and Surgeon  Anesthesia Plan Comments: (Pt declined spinal anesthetic)       Anesthesia Quick Evaluation

## 2016-09-15 NOTE — Anesthesia Postprocedure Evaluation (Signed)
Anesthesia Post Note  Patient: Jeremy Vasquez  Procedure(s) Performed: Procedure(s) (LRB): RIGHT TOTAL KNEE ARTHROPLASTY (Right)  Patient location during evaluation: PACU Anesthesia Type: General Level of consciousness: awake and alert and patient cooperative Pain management: pain level controlled Vital Signs Assessment: post-procedure vital signs reviewed and stable Respiratory status: spontaneous breathing and respiratory function stable Cardiovascular status: stable Anesthetic complications: no       Last Vitals:  Vitals:   09/15/16 1230 09/15/16 1336  BP: (!) 142/71 (!) 150/99  Pulse: 62 (!) 53  Resp: 12 12  Temp: 36.6 C 36.6 C    Last Pain:  Vitals:   09/15/16 1336  TempSrc: Axillary  PainSc:                  Luddie Boghosian S

## 2016-09-15 NOTE — Op Note (Deleted)
  The note originally documented on this encounter has been moved the the encounter in which it belongs.  

## 2016-09-15 NOTE — Brief Op Note (Signed)
09/15/2016  9:14 AM  PATIENT:  Mack Guise  51 y.o. male  PRE-OPERATIVE DIAGNOSIS:  Osteoarthritis right knee  POST-OPERATIVE DIAGNOSIS:  Osteoarthritis right knee  PROCEDURE:  Procedure(s) with comments: RIGHT TOTAL KNEE ARTHROPLASTY (Right) - Adductor Block  SURGEON:  Surgeon(s) and Role:    * Mcarthur Rossetti, MD - Primary  PHYSICIAN ASSISTANT: Benita Stabile, PA-C  ANESTHESIA:   regional and general  EBL:  Total I/O In: 2000 [I.V.:2000] Out: 50 [Blood:50]   TOURNIQUET:   Total Tourniquet Time Documented: Thigh (Right) - 61 minutes Total: Thigh (Right) - 61 minutes   DICTATION: .Other Dictation: Dictation Number [id number]  PLAN OF CARE: Admit to inpatient   PATIENT DISPOSITION:  PACU - hemodynamically stable.   Delay start of Pharmacological VTE agent (>24hrs) due to surgical blood loss or risk of bleeding: no

## 2016-09-15 NOTE — Progress Notes (Signed)
D; severe sleep apnea, oriented when pt is awake,  apenic when fell in sleep, Notified Dr.Hordierne, okay to put CPAP @ PACU. Notified Resp.Tx and get mask from wife.

## 2016-09-16 LAB — CBC
HEMATOCRIT: 38.2 % — AB (ref 39.0–52.0)
Hemoglobin: 12.4 g/dL — ABNORMAL LOW (ref 13.0–17.0)
MCH: 27.5 pg (ref 26.0–34.0)
MCHC: 32.5 g/dL (ref 30.0–36.0)
MCV: 84.7 fL (ref 78.0–100.0)
Platelets: 203 10*3/uL (ref 150–400)
RBC: 4.51 MIL/uL (ref 4.22–5.81)
RDW: 14.6 % (ref 11.5–15.5)
WBC: 15 10*3/uL — ABNORMAL HIGH (ref 4.0–10.5)

## 2016-09-16 LAB — BASIC METABOLIC PANEL
Anion gap: 7 (ref 5–15)
BUN: 14 mg/dL (ref 6–20)
CALCIUM: 8.4 mg/dL — AB (ref 8.9–10.3)
CHLORIDE: 105 mmol/L (ref 101–111)
CO2: 25 mmol/L (ref 22–32)
Creatinine, Ser: 0.88 mg/dL (ref 0.61–1.24)
GFR calc non Af Amer: >60 mL/min (ref >60)
GLUCOSE: 141 mg/dL — AB (ref 65–99)
Potassium: 4.4 mmol/L (ref 3.5–5.1)
Sodium: 137 mmol/L (ref 135–145)

## 2016-09-16 MED ORDER — OXYCODONE-ACETAMINOPHEN 5-325 MG PO TABS: 1.0000 | ORAL_TABLET | ORAL | 0 refills | Status: DC | PRN

## 2016-09-16 MED ORDER — HYDROMORPHONE HCL 1 MG/ML IJ SOLN
1.0000 mg | INTRAMUSCULAR | Status: DC | PRN
  Administered 2016-09-16 – 2016-09-17 (×2): 1 mg via INTRAVENOUS
  Filled 2016-09-16 (×2): qty 1

## 2016-09-16 MED ORDER — METHOCARBAMOL 500 MG PO TABS: 500.0000 mg | ORAL_TABLET | Freq: Four times a day (QID) | ORAL | 0 refills | Status: DC | PRN

## 2016-09-16 NOTE — Discharge Instructions (Signed)
INSTRUCTIONS AFTER JOINT REPLACEMENT  ° °o Remove items at home which could result in a fall. This includes throw rugs or furniture in walking pathways °o ICE to the affected joint every three hours while awake for 30 minutes at a time, for at least the first 3-5 days, and then as needed for pain and swelling.  Continue to use ice for pain and swelling. You may notice swelling that will progress down to the foot and ankle.  This is normal after surgery.  Elevate your leg when you are not up walking on it.   °o Continue to use the breathing machine you got in the hospital (incentive spirometer) which will help keep your temperature down.  It is common for your temperature to cycle up and down following surgery, especially at night when you are not up moving around and exerting yourself.  The breathing machine keeps your lungs expanded and your temperature down. ° ° °DIET:  As you were doing prior to hospitalization, we recommend a well-balanced diet. ° °DRESSING / WOUND CARE / SHOWERING ° °Keep the surgical dressing until follow up.  The dressing is water proof, so you can shower without any extra covering.  IF THE DRESSING FALLS OFF or the wound gets wet inside, change the dressing with sterile gauze.  Please use good hand washing techniques before changing the dressing.  Do not use any lotions or creams on the incision until instructed by your surgeon.   ° °ACTIVITY ° °o Increase activity slowly as tolerated, but follow the weight bearing instructions below.   °o No driving for 6 weeks or until further direction given by your physician.  You cannot drive while taking narcotics.  °o No lifting or carrying greater than 10 lbs. until further directed by your surgeon. °o Avoid periods of inactivity such as sitting longer than an hour when not asleep. This helps prevent blood clots.  °o You may return to work once you are authorized by your doctor.  ° ° ° °WEIGHT BEARING  ° °Weight bearing as tolerated with assist  device (walker, cane, etc) as directed, use it as long as suggested by your surgeon or therapist, typically at least 4-6 weeks. ° ° °EXERCISES ° °Results after joint replacement surgery are often greatly improved when you follow the exercise, range of motion and muscle strengthening exercises prescribed by your doctor. Safety measures are also important to protect the joint from further injury. Any time any of these exercises cause you to have increased pain or swelling, decrease what you are doing until you are comfortable again and then slowly increase them. If you have problems or questions, call your caregiver or physical therapist for advice.  ° °Rehabilitation is important following a joint replacement. After just a few days of immobilization, the muscles of the leg can become weakened and shrink (atrophy).  These exercises are designed to build up the tone and strength of the thigh and leg muscles and to improve motion. Often times heat used for twenty to thirty minutes before working out will loosen up your tissues and help with improving the range of motion but do not use heat for the first two weeks following surgery (sometimes heat can increase post-operative swelling).  ° °These exercises can be done on a training (exercise) mat, on the floor, on a table or on a bed. Use whatever works the best and is most comfortable for you.    Use music or television while you are exercising so that   the exercises are a pleasant break in your day. This will make your life better with the exercises acting as a break in your routine that you can look forward to.   Perform all exercises about fifteen times, three times per day or as directed.  You should exercise both the operative leg and the other leg as well. ° °Exercises include: °  °• Quad Sets - Tighten up the muscle on the front of the thigh (Quad) and hold for 5-10 seconds.   °• Straight Leg Raises - With your knee straight (if you were given a brace, keep it on),  lift the leg to 60 degrees, hold for 3 seconds, and slowly lower the leg.  Perform this exercise against resistance later as your leg gets stronger.  °• Leg Slides: Lying on your back, slowly slide your foot toward your buttocks, bending your knee up off the floor (only go as far as is comfortable). Then slowly slide your foot back down until your leg is flat on the floor again.  °• Angel Wings: Lying on your back spread your legs to the side as far apart as you can without causing discomfort.  °• Hamstring Strength:  Lying on your back, push your heel against the floor with your leg straight by tightening up the muscles of your buttocks.  Repeat, but this time bend your knee to a comfortable angle, and push your heel against the floor.  You may put a pillow under the heel to make it more comfortable if necessary.  ° °A rehabilitation program following joint replacement surgery can speed recovery and prevent re-injury in the future due to weakened muscles. Contact your doctor or a physical therapist for more information on knee rehabilitation.  ° ° °CONSTIPATION ° °Constipation is defined medically as fewer than three stools per week and severe constipation as less than one stool per week.  Even if you have a regular bowel pattern at home, your normal regimen is likely to be disrupted due to multiple reasons following surgery.  Combination of anesthesia, postoperative narcotics, change in appetite and fluid intake all can affect your bowels.  ° °YOU MUST use at least one of the following options; they are listed in order of increasing strength to get the job done.  They are all available over the counter, and you may need to use some, POSSIBLY even all of these options:   ° °Drink plenty of fluids (prune juice may be helpful) and high fiber foods °Colace 100 mg by mouth twice a day  °Senokot for constipation as directed and as needed Dulcolax (bisacodyl), take with full glass of water  °Miralax (polyethylene glycol)  once or twice a day as needed. ° °If you have tried all these things and are unable to have a bowel movement in the first 3-4 days after surgery call either your surgeon or your primary doctor.   ° °If you experience loose stools or diarrhea, hold the medications until you stool forms back up.  If your symptoms do not get better within 1 week or if they get worse, check with your doctor.  If you experience "the worst abdominal pain ever" or develop nausea or vomiting, please contact the office immediately for further recommendations for treatment. ° ° °ITCHING:  If you experience itching with your medications, try taking only a single pain pill, or even half a pain pill at a time.  You can also use Benadryl over the counter for itching or also to   help with sleep.   TED HOSE STOCKINGS:  Use stockings on both legs until for at least 2 weeks or as directed by physician office. They may be removed at night for sleeping.  MEDICATIONS:  See your medication summary on the After Visit Summary that nursing will review with you.  You may have some home medications which will be placed on hold until you complete the course of blood thinner medication.  It is important for you to complete the blood thinner medication as prescribed.  PRECAUTIONS:  If you experience chest pain or shortness of breath - call 911 immediately for transfer to the hospital emergency department.   If you develop a fever greater that 101 F, purulent drainage from wound, increased redness or drainage from wound, foul odor from the wound/dressing, or calf pain - CONTACT YOUR SURGEON.                                                   FOLLOW-UP APPOINTMENTS:  If you do not already have a post-op appointment, please call the office for an appointment to be seen by your surgeon.  Guidelines for how soon to be seen are listed in your After Visit Summary, but are typically between 1-4 weeks after surgery.  OTHER INSTRUCTIONS:   Knee  Replacement:  Do not place pillow under knee, focus on keeping the knee straight while resting. CPM instructions: 0-90 degrees, 2 hours in the morning, 2 hours in the afternoon, and 2 hours in the evening. Place foam block, curve side up under heel at all times except when in CPM or when walking.  DO NOT modify, tear, cut, or change the foam block in any way.  MAKE SURE YOU:   Understand these instructions.   Get help right away if you are not doing well or get worse.    Thank you for letting us be a part of your medical care team.  It is a privilege we respect greatly.  We hope these instructions will help you stay on track for a fast and full recovery!   Information on my medicine - XARELTO (Rivaroxaban)  This medication education was reviewed with me or my healthcare representative as part of my discharge preparation.  The pharmacist that spoke with me during my hospital stay was:  Biagio Borg, [facility]  Why was Xarelto prescribed for you? Xarelto was prescribed for you to reduce the risk of blood clots forming after orthopedic surgery. The medical term for these abnormal blood clots is venous thromboembolism (VTE).  What do you need to know about xarelto ? Take your Xarelto ONCE DAILY at the same time every day. You may take it either with or without food.  If you have difficulty swallowing the tablet whole, you may crush it and mix in applesauce just prior to taking your dose.  Take Xarelto exactly as prescribed by your doctor and DO NOT stop taking Xarelto without talking to the doctor who prescribed the medication.  Stopping without other VTE prevention medication to take the place of Xarelto may increase your risk of developing a clot.  After discharge, you should have regular check-up appointments with your healthcare provider that is prescribing your Xarelto.    What do you do if you miss a dose? If you miss a dose, take it as soon as you  remember on the same  day then continue your regularly scheduled once daily regimen the next day. Do not take two doses of Xarelto on the same day.   Important Safety Information A possible side effect of Xarelto is bleeding. You should call your healthcare provider right away if you experience any of the following: ? Bleeding from an injury or your nose that does not stop. ? Unusual colored urine (red or dark brown) or unusual colored stools (red or black). ? Unusual bruising for unknown reasons. ? A serious fall or if you hit your head (even if there is no bleeding).  Some medicines may interact with Xarelto and might increase your risk of bleeding while on Xarelto. To help avoid this, consult your healthcare provider or pharmacist prior to using any new prescription or non-prescription medications, including herbals, vitamins, non-steroidal anti-inflammatory drugs (NSAIDs) and supplements.  This website has more information on Xarelto: [email or web address]

## 2016-09-16 NOTE — Progress Notes (Signed)
Occupational Therapy Evaluation Patient Details Name: Jeremy Vasquez MRN: [id number] DOB: 12/07/1965 Today's Date: 09/16/2016    History of Present Illness Pt s/p R TKR and with hx of multiple back surgeries   Clinical Impression   PTA, pt independent with all mobility and ADL and worked as a Chief Strategy Officer. Pt currently S for mobility @ RW level and min A with LB ADL. Completed all education regarding compensatory techniques for ADL, functional mobility, home safety and reducing risk of falls. Pt able to return demonstrate. Pt safe to DC home with intermittent S when medically stable and cleared by PT. OT signing off.     Follow Up Recommendations  No OT follow up;Supervision - Intermittent    Equipment Recommendations  Other (comment) (wide RW)    Recommendations for Other Services       Precautions / Restrictions Precautions Precautions: Fall;Knee Required Braces or Orthoses: Knee Immobilizer - Right Knee Immobilizer - Right: Discontinue once straight leg raise with < 10 degree lag Restrictions Weight Bearing Restrictions: No Other Position/Activity Restrictions: WBAT      Mobility Bed Mobility Overal bed mobility: Needs Assistance             General bed mobility comments: OOB in chair  Transfers Overall transfer level: Needs assistance Equipment used: Rolling walker (2 wheeled) Transfers: Sit to/from Stand Sit to Stand: Supervision         General transfer comment: good hand placement/safety    Balance      no apparent balance deficits during ADL taskl                                     ADL either performed or assessed with clinical judgement   ADL Overall ADL's : Needs assistance/impaired     Grooming: Modified independent   Upper Body Bathing: Modified independent   Lower Body Bathing: Minimal assistance   Upper Body Dressing : Set up   Lower Body Dressing: Minimal assistance;Sit to/from stand   Toilet Transfer:  Supervision/safety;Ambulation;RW   Toileting- Clothing Manipulation and Hygiene: Supervision/safety;Sit to/from stand   Tub/ Shower Transfer: Supervision/safety;Walk-in shower;Rolling walker;Ambulation   Functional mobility during ADLs: Supervision/safety;Rolling walker General ADL Comments: Completed education regarding compensatory techniques, home safety, reducing risk of falls and safe shower transfer technique. Pt able to return demonstrate.   Recommend pt use a reacher for LB dressing. Wife can assist as needed.     Vision Baseline Vision/History: Wears glasses Wears Glasses: Reading only       Perception     Praxis      Pertinent Vitals/Pain Pain Assessment: 0-10 Pain Score: 1  Faces Pain Scale: Hurts even more Pain Location: R knee with WB or movement Pain Descriptors / Indicators: Aching;Sore;Burning     Hand Dominance Right   Extremity/Trunk Assessment Upper Extremity Assessment Upper Extremity Assessment: Overall WFL for tasks assessed   Lower Extremity Assessment Lower Extremity Assessment: Defer to PT evaluation   Cervical / Trunk Assessment Cervical / Trunk Assessment: Other exceptions (hx of 4 back surgeries)   Communication Communication Communication: No difficulties   Cognition Arousal/Alertness: Awake/alert Behavior During Therapy: WFL for tasks assessed/performed Overall Cognitive Status: Within Functional Limits for tasks assessed                                     General Comments  Exercises     Shoulder Instructions      Home Living Family/patient expects to be discharged to:: Private residence Living Arrangements: Spouse/significant other;Children Available Help at Discharge: Family Type of Home: House Home Access: Stairs to enter Technical brewer of Steps: 1 Entrance Stairs-Rails: Left (handle he can hold onto) Home Layout: One level     Bathroom Shower/Tub: Occupational psychologist:  Handicapped height Bathroom Accessibility: Yes How Accessible: Accessible via walker Home Equipment: Crutches;Grab bars - toilet;Grab bars - tub/shower;Shower seat - built in          Prior Functioning/Environment Level of Independence: Independent;Independent with assistive device(s)  Gait / Transfers Assistance Needed: crutches as needed              OT Problem List: Decreased strength;Decreased range of motion;Decreased knowledge of use of DME or AE;Obesity;Pain      OT Treatment/Interventions:      OT Goals(Current goals can be found in the care plan section) Acute Rehab OT Goals Patient Stated Goal: get back to work OT Goal Formulation: All assessment and education complete, DC therapy  OT Frequency:     Barriers to D/C:            Co-evaluation              End of Session Equipment Utilized During Treatment: Gait belt Nurse Communication: Mobility status  Activity Tolerance: Patient tolerated treatment well Patient left: in chair;with call bell/phone within reach  OT Visit Diagnosis: Unsteadiness on feet (R26.81);Pain Pain - Right/Left: Right Pain - part of body: Knee                Time: 1203-1231 OT Time Calculation (min): 28 min Charges:  OT General Charges $OT Visit: 1 Procedure OT Evaluation $OT Eval Low Complexity: 1 Procedure OT Treatments $Self Care/Home Management : 8-22 mins G-Codes:     [facility], OT/L  [phone] 09/16/2016  Latiqua Daloia,HILLARY 09/16/2016, 12:36 PM

## 2016-09-16 NOTE — Progress Notes (Signed)
Physical Therapy Treatment Patient Details Name: Jeremy Vasquez MRN: [id number] DOB: 03-03-1966 Today's Date: 09/16/2016    History of Present Illness Pt s/p R TKR and with hx of multiple back surgeries    PT Comments    Pt motivated and progressing well with mobility.   Follow Up Recommendations  Home health PT     Equipment Recommendations  Rolling walker with 5" wheels    Recommendations for Other Services OT consult     Precautions / Restrictions Precautions Precautions: Fall;Knee Required Braces or Orthoses: Knee Immobilizer - Right Knee Immobilizer - Right: Discontinue once straight leg raise with < 10 degree lag Restrictions Weight Bearing Restrictions: No Other Position/Activity Restrictions: WBAT    Mobility  Bed Mobility Overal bed mobility: Needs Assistance Bed Mobility: Supine to Sit     Supine to sit: Min assist     General bed mobility comments: cues for sequence with min assist for R LE  Transfers Overall transfer level: Needs assistance Equipment used: Rolling walker (2 wheeled) Transfers: Sit to/from Stand Sit to Stand: Min guard         General transfer comment: pt self cueing for hand placement  Ambulation/Gait Ambulation/Gait assistance: Min assist;Min guard Ambulation Distance (Feet): 94 Feet Assistive device: Rolling walker (2 wheeled) Gait Pattern/deviations: Step-to pattern;Decreased step length - right;Decreased step length - left;Shuffle;Trunk flexed Gait velocity: decr Gait velocity interpretation: Below normal speed for age/gender General Gait Details: cues for sequence, posture and position from [employer]            Wheelchair Mobility    Modified Rankin (Stroke Patients Only)       Balance                                            Cognition Arousal/Alertness: Awake/alert Behavior During Therapy: WFL for tasks assessed/performed Overall Cognitive Status: Within Functional Limits for tasks  assessed                                        Exercises Total Joint Exercises Ankle Circles/Pumps: AROM;Both;Supine;20 reps Quad Sets: AROM;Both;10 reps;Supine Heel Slides: AAROM;Right;15 reps;Supine Straight Leg Raises: AAROM;Right;15 reps;Supine Goniometric ROM: AAROM R knee -10- 70    General Comments        Pertinent Vitals/Pain Pain Assessment: 0-10 Pain Score: 5  Faces Pain Scale: Hurts even more Pain Location: R knee with ambulation Pain Descriptors / Indicators: Aching;Sore;Burning Pain Intervention(s): Limited activity within patient's tolerance;Monitored during session;Premedicated before session;Ice applied    Home Living Family/patient expects to be discharged to:: Private residence Living Arrangements: Spouse/significant other;Children Available Help at Discharge: Family Type of Home: House Home Access: Stairs to enter Entrance Stairs-Rails: Left (handle he can hold onto) Home Layout: One level Home Equipment: Crutches;Grab bars - toilet;Grab bars - tub/shower;Shower seat - built in      Prior Function Level of Independence: Independent;Independent with assistive device(s)  Gait / Transfers Assistance Needed: crutches as needed       PT Goals (current goals can now be found in the care plan section) Acute Rehab PT Goals Patient Stated Goal: get back to work PT Goal Formulation: With patient Time For Goal Achievement: 09/19/16 Potential to Achieve Goals: Good Progress towards PT goals: Progressing toward goals    Frequency  7X/week      PT Plan Current plan remains appropriate    Co-evaluation             End of Session Equipment Utilized During Treatment: Right knee immobilizer Activity Tolerance: Patient tolerated treatment well Patient left: in chair;with call bell/phone within reach Nurse Communication: Mobility status PT Visit Diagnosis: Unsteadiness on feet (R26.81);Difficulty in walking, not elsewhere  classified (R26.2)     Time: 9038-3338 PT Time Calculation (min) (ACUTE ONLY): 25 min  Charges:  $Gait Training: 23-37 mins $Therapeutic Exercise: 8-22 mins                    G Codes:       Pg [phone]    Jeremy Vasquez 09/16/2016, 1:26 PM

## 2016-09-16 NOTE — Progress Notes (Signed)
Physical Therapy Treatment Patient Details Name: Jeremy Vasquez MRN: [id number] DOB: 06/22/1965 Today's Date: 09/16/2016    History of Present Illness Pt s/p R TKR and with hx of multiple back surgeries    PT Comments    Therex program initiated   Follow Up Recommendations  Home health PT     Equipment Recommendations  Rolling walker with 5" wheels    Recommendations for Other Services OT consult     Precautions / Restrictions Precautions Precautions: Fall;Knee Required Braces or Orthoses: Knee Immobilizer - Right Knee Immobilizer - Right: Discontinue once straight leg raise with < 10 degree lag Restrictions Weight Bearing Restrictions: No Other Position/Activity Restrictions: WBAT    Mobility  Bed Mobility Overal bed mobility: Needs Assistance             General bed mobility comments: OOB in chair  Transfers Overall transfer level: Needs assistance Equipment used: Rolling walker (2 wheeled) Transfers: Sit to/from Stand Sit to Stand: Supervision         General transfer comment: good hand placement/safety  Ambulation/Gait                 Stairs            Wheelchair Mobility    Modified Rankin (Stroke Patients Only)       Balance                                            Cognition Arousal/Alertness: Awake/alert Behavior During Therapy: WFL for tasks assessed/performed Overall Cognitive Status: Within Functional Limits for tasks assessed                                        Exercises Total Joint Exercises Ankle Circles/Pumps: AROM;Both;Supine;20 reps Quad Sets: AROM;Both;10 reps;Supine Heel Slides: AAROM;Right;15 reps;Supine Straight Leg Raises: AAROM;Right;15 reps;Supine Goniometric ROM: AAROM R knee -10- 70    General Comments        Pertinent Vitals/Pain Pain Assessment: 0-10 Pain Score: 6  Faces Pain Scale: Hurts even more Pain Location: R knee with therex Pain Descriptors /  Indicators: Aching;Sore;Burning Pain Intervention(s): Limited activity within patient's tolerance;Monitored during session;Premedicated before session;Ice applied    Home Living Family/patient expects to be discharged to:: Private residence Living Arrangements: Spouse/significant other;Children Available Help at Discharge: Family Type of Home: House Home Access: Stairs to enter Entrance Stairs-Rails: Left (handle he can hold onto) Home Layout: One level Home Equipment: Crutches;Grab bars - toilet;Grab bars - tub/shower;Shower seat - built in      Prior Function Level of Independence: Independent;Independent with assistive device(s)  Gait / Transfers Assistance Needed: crutches as needed       PT Goals (current goals can now be found in the care plan section) Acute Rehab PT Goals Patient Stated Goal: get back to work PT Goal Formulation: With patient Time For Goal Achievement: 09/19/16 Potential to Achieve Goals: Good Progress towards PT goals: Progressing toward goals    Frequency    7X/week      PT Plan Current plan remains appropriate    Co-evaluation             End of Session Equipment Utilized During Treatment: Right knee immobilizer Activity Tolerance: Patient tolerated treatment well Patient left: in bed;with call bell/phone within reach Nurse Communication:  Mobility status PT Visit Diagnosis: Unsteadiness on feet (R26.81);Difficulty in walking, not elsewhere classified (R26.2)     Time: 5001-6429 PT Time Calculation (min) (ACUTE ONLY): 22 min  Charges:  $Therapeutic Exercise: 8-22 mins                    G Codes:       Pg [phone]    Candy Leverett 09/16/2016, 1:20 PM

## 2016-09-16 NOTE — Progress Notes (Signed)
Subjective: 1 Day Post-Op Procedure(s) (LRB): RIGHT TOTAL KNEE ARTHROPLASTY (Right) Patient reports pain as moderate.    Objective: Vital signs in last 24 hours: Temp:  [97.6 F (36.4 C)-98.8 F (37.1 C)] 98.8 F (37.1 C) (04/21 1048) Pulse Rate:  [53-86] 69 (04/21 1048) Resp:  [12-18] 18 (04/21 1048) BP: (113-177)/(63-99) 124/69 (04/21 1048) SpO2:  [90 %-100 %] 97 % (04/21 1048)  Intake/Output from previous day: 04/20 0701 - 04/21 0700 In: 5558.5 [P.O.:996; I.V.:3912.5; IV Piggyback:150] Out: 1650 [Urine:1600; Blood:50] Intake/Output this shift: Total I/O In: 360 [P.O.:360] Out: 0    Recent Labs  09/16/16 0513  HGB 12.4*    Recent Labs  09/16/16 0513  WBC 15.0*  RBC 4.51  HCT 38.2*  PLT 203    Recent Labs  09/16/16 0513  NA 137  K 4.4  CL 105  CO2 25  BUN 14  CREATININE 0.88  GLUCOSE 141*  CALCIUM 8.4*   No results for input(s): LABPT, INR in the last 72 hours.  Sensation intact distally Intact pulses distally Dorsiflexion/Plantar flexion intact Incision: scant drainage No cellulitis present Compartment soft  Assessment/Plan: 1 Day Post-Op Procedure(s) (LRB): RIGHT TOTAL KNEE ARTHROPLASTY (Right) Up with therapy Plan for discharge tomorrow Discharge home with home health  Mcarthur Rossetti 09/16/2016, 11:07 AM

## 2016-09-16 NOTE — Progress Notes (Signed)
Physical Therapy Treatment Patient Details Name: Jeremy Vasquez MRN: [id number] DOB: 06-11-1965 Today's Date: 09/16/2016    History of Present Illness Pt s/p R TKR and with hx of multiple back surgeries    PT Comments    Pt progressing well with mobility and hopeful for dc home tomorrow.   Follow Up Recommendations  Home health PT     Equipment Recommendations  Rolling walker with 5" wheels    Recommendations for Other Services OT consult     Precautions / Restrictions Precautions Precautions: Fall;Knee Required Braces or Orthoses: Knee Immobilizer - Right Knee Immobilizer - Right: Discontinue once straight leg raise with < 10 degree lag Restrictions Weight Bearing Restrictions: No Other Position/Activity Restrictions: WBAT    Mobility  Bed Mobility Overal bed mobility: Needs Assistance Bed Mobility: Sit to Supine     Supine to sit: Min assist Sit to supine: Min guard   General bed mobility comments: cues for sequence  Transfers Overall transfer level: Needs assistance Equipment used: Rolling walker (2 wheeled) Transfers: Sit to/from Stand Sit to Stand: Min guard         General transfer comment: pt self cueing for hand placement  Ambulation/Gait Ambulation/Gait assistance: Min guard Ambulation Distance (Feet): 159 Feet Assistive device: Rolling walker (2 wheeled) Gait Pattern/deviations: Step-to pattern;Decreased step length - right;Decreased step length - left;Shuffle;Trunk flexed Gait velocity: decr Gait velocity interpretation: Below normal speed for age/gender General Gait Details: cues for sequence, posture and position from [employer]            Wheelchair Mobility    Modified Rankin (Stroke Patients Only)       Balance                                            Cognition Arousal/Alertness: Awake/alert Behavior During Therapy: WFL for tasks assessed/performed Overall Cognitive Status: Within Functional Limits  for tasks assessed                                        Exercises Total Joint Exercises Ankle Circles/Pumps: AROM;Both;Supine;20 reps Quad Sets: AROM;Both;10 reps;Supine Heel Slides: AAROM;Right;15 reps;Supine Straight Leg Raises: AAROM;Right;15 reps;Supine Goniometric ROM: AAROM R knee -10- 70    General Comments        Pertinent Vitals/Pain Pain Assessment: 0-10 Pain Score: 4  Faces Pain Scale: Hurts even more Pain Location: R knee with ambulation Pain Descriptors / Indicators: Aching;Sore;Burning Pain Intervention(s): Limited activity within patient's tolerance;Monitored during session;Premedicated before session;Ice applied    Home Living Family/patient expects to be discharged to:: Private residence Living Arrangements: Spouse/significant other;Children Available Help at Discharge: Family Type of Home: House Home Access: Stairs to enter Entrance Stairs-Rails: Left (handle he can hold onto) Home Layout: One level Home Equipment: Crutches;Grab bars - toilet;Grab bars - tub/shower;Shower seat - built in      Prior Function Level of Independence: Independent;Independent with assistive device(s)  Gait / Transfers Assistance Needed: crutches as needed       PT Goals (current goals can now be found in the care plan section) Acute Rehab PT Goals Patient Stated Goal: get back to work PT Goal Formulation: With patient Time For Goal Achievement: 09/19/16 Potential to Achieve Goals: Good Progress towards PT goals: Progressing toward goals    Frequency  7X/week      PT Plan Current plan remains appropriate    Co-evaluation             End of Session Equipment Utilized During Treatment: Right knee immobilizer Activity Tolerance: Patient tolerated treatment well Patient left: in bed;with call bell/phone within reach Nurse Communication: Mobility status PT Visit Diagnosis: Unsteadiness on feet (R26.81);Difficulty in walking, not elsewhere  classified (R26.2)     Time: 0761-5183 PT Time Calculation (min) (ACUTE ONLY): 26 min  Charges:  $Gait Training: 23-37 mins $Therapeutic Exercise: 8-22 mins                    G Codes:       Pg [phone]    Josealfredo Adkins 09/16/2016, 3:28 PM

## 2016-09-17 MED ORDER — RIVAROXABAN 10 MG PO TABS: 10.0000 mg | ORAL_TABLET | Freq: Every day | ORAL | 0 refills | Status: DC
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
## 2016-09-17 NOTE — Progress Notes (Signed)
Subjective: 2 Days Post-Op Procedure(s) (LRB): RIGHT TOTAL KNEE ARTHROPLASTY (Right) Patient reports pain as moderate.    Objective: Vital signs in last 24 hours: Temp:  [97.5 F (36.4 C)-98.8 F (37.1 C)] 97.5 F (36.4 C) (04/22 0538) Pulse Rate:  [69-95] 81 (04/22 0538) Resp:  [18] 18 (04/22 0538) BP: (121-131)/(69-98) 127/98 (04/22 0538) SpO2:  [94 %-99 %] 94 % (04/22 0538)  Intake/Output from previous day: 04/21 0701 - 04/22 0700 In: 2492.5 [P.O.:720; I.V.:1772.5] Out: 1401 [Urine:1400; Stool:1] Intake/Output this shift: No intake/output data recorded.   Recent Labs  09/16/16 0513  HGB 12.4*    Recent Labs  09/16/16 0513  WBC 15.0*  RBC 4.51  HCT 38.2*  PLT 203    Recent Labs  09/16/16 0513  NA 137  K 4.4  CL 105  CO2 25  BUN 14  CREATININE 0.88  GLUCOSE 141*  CALCIUM 8.4*   No results for input(s): LABPT, INR in the last 72 hours.  Sensation intact distally Intact pulses distally Dorsiflexion/Plantar flexion intact Incision: dressing C/D/I No cellulitis present Compartment soft  Assessment/Plan: 2 Days Post-Op Procedure(s) (LRB): RIGHT TOTAL KNEE ARTHROPLASTY (Right) Up with therapy Discharge home with home health today. Will use Xarelto for DVT coverage.  Mcarthur Rossetti 09/17/2016, 8:53 AM

## 2016-09-17 NOTE — Care Management Note (Signed)
Case Management Note  Patient Details  Name: Virgil Lightner MRN: [id number] Date of Birth: 1966/01/25  Subjective/Objective:   Right TKA                Action/Plan: Discharge Planning: NCM spoke to pt and states he lives in [place], will contact agencies on 09/18/2016 to arrange [facility] PT. Made pt and aware. Wanted NCM to follow up with [facility] to see if they accept his insurance.   Expected Discharge Date:  09/17/16               Expected Discharge Plan:  [place]  In-House Referral:  NA  Discharge planning Services  CM Consult  Post Acute Care Choice:  Home Health Choice offered to:     DME Arranged:  [facility] DME Agency:  [facility]:  PT [facility] Agency:     Status of Service:  In process, will continue to follow  If discussed at Long Length of Stay Meetings, dates discussed:    Additional Comments:  Erenest Rasher, RN 09/17/2016, 4:48 PM

## 2016-09-17 NOTE — Progress Notes (Signed)
Patient and spouse were given discharge instructions and prescriptions. All questions were answered and patient was taken to main entrance in a wheelchair.

## 2016-09-17 NOTE — Progress Notes (Signed)
Physical Therapy Treatment Patient Details Name: Jeremy Vasquez MRN: [id number] DOB: February 15, 1966 Today's Date: 09/17/2016    History of Present Illness Pt s/p R TKR and with hx of multiple back surgeries    PT Comments    Progressing but with more pain last night and very little sleep; pt plans to work on HEP later today; amb limited d/t pain but did well up/down one step; feels ready for D/C from PT standpoint  Follow Up Recommendations  Home health PT     Equipment Recommendations  Rolling walker with 5" wheels (wide)    Recommendations for Other Services       Precautions / Restrictions Precautions Precautions: Fall;Knee Precaution Comments: IND SLRs, KI not used Required Braces or Orthoses: Knee Immobilizer - Right Knee Immobilizer - Right: Discontinue once straight leg raise with < 10 degree lag Restrictions Other Position/Activity Restrictions: WBAT    Mobility  Bed Mobility               General bed mobility comments: in chair  Transfers Overall transfer level: Needs assistance Equipment used: Rolling walker (2 wheeled) Transfers: Sit to/from Stand Sit to Stand: Min guard;Supervision         General transfer comment: cues to control descent  Ambulation/Gait Ambulation/Gait assistance: Supervision;Min guard Ambulation Distance (Feet): 45 Feet Assistive device: Rolling walker (2 wheeled) Gait Pattern/deviations: Step-to pattern;Decreased step length - right;Decreased step length - left;Antalgic Gait velocity: decr   General Gait Details: cues for sequence, posture and position from RW   Stairs Stairs: Yes   Stair Management: No rails;Step to pattern;Backwards;With walker Number of Stairs: 1 General stair comments: cues for sequence  Wheelchair Mobility    Modified Rankin (Stroke Patients Only)       Balance                                            Cognition Arousal/Alertness: Awake/alert Behavior During Therapy:  WFL for tasks assessed/performed Overall Cognitive Status: Within Functional Limits for tasks assessed                                        Exercises Total Joint Exercises Ankle Circles/Pumps:  (deferred d/t pain incr'ing-just began to improve before PT)    General Comments        Pertinent Vitals/Pain Pain Assessment: 0-10 Pain Score: 4  Pain Location: R knee with ambulation Pain Descriptors / Indicators: Aching;Sore;Burning Pain Intervention(s): Limited activity within patient's tolerance;Monitored during session;Premedicated before session;Ice applied    Home Living                      Prior Function            PT Goals (current goals can now be found in the care plan section) Acute Rehab PT Goals Patient Stated Goal: less knee pain PT Goal Formulation: With patient Time For Goal Achievement: 09/19/16 Potential to Achieve Goals: Good Progress towards PT goals: Progressing toward goals    Frequency    7X/week      PT Plan Current plan remains appropriate    Co-evaluation             End of Session Equipment Utilized During Treatment: Right knee immobilizer Activity Tolerance: Patient tolerated treatment well Patient  left: in chair;with call bell/phone within reach Nurse Communication: Mobility status (ready for D/C) PT Visit Diagnosis: Unsteadiness on feet (R26.81);Difficulty in walking, not elsewhere classified (R26.2)     Time: 2248-2500 PT Time Calculation (min) (ACUTE ONLY): 30 min  Charges:  $Gait Training: 23-37 mins                    G Codes:          Brayli Klingbeil 09/21/2016, 12:26 PM

## 2016-09-17 NOTE — Progress Notes (Signed)
Contacted [facility] DME for bariatric RW for home. Jonnie Finner RN CCM Case Mgmt phone [phone]

## 2016-09-18 ENCOUNTER — Telehealth (INDEPENDENT_AMBULATORY_CARE_PROVIDER_SITE_OTHER): Payer: Self-pay | Admitting: Orthopaedic Surgery

## 2016-09-18 NOTE — Telephone Encounter (Signed)
Patient called asked when will he be set up for (PT) The number to contact patient is [phone] Cell or [phone] Home

## 2016-09-19 NOTE — Telephone Encounter (Signed)
Can you help me so we can send this to whoever does this at the hospital, the case worker I guess. He lives in [place], clearly [facility] or [facility] can't do this

## 2016-09-19 NOTE — Progress Notes (Addendum)
NCM spoke to Jeremy Vasquez and explained NCM was working on arranging [facility] Jeremy Vasquez.Jonnie Finner RN CCM Case Mgmt phone [phone]  [facility] do not service [place]. Contacted [facility] and [facility] they do not accept insurance. [facility] and they accept insurance. Will check benefits and arrange start of care. Will contact NCM with update. Jonnie Finner RN CCM Case Mgmt phone [phone]  NCM received call back from [facility] and they can accept for HHPT. Jeremy Vasquez agreeable to $50 copay for each visit. Jeremy Vasquez soc is on 09/20/2016. Jonnie Finner RN CCM Case Mgmt phone [phone]

## 2016-09-28 ENCOUNTER — Ambulatory Visit (INDEPENDENT_AMBULATORY_CARE_PROVIDER_SITE_OTHER): Payer: BLUE CROSS/BLUE SHIELD | Admitting: Physician Assistant

## 2016-09-28 ENCOUNTER — Inpatient Hospital Stay (INDEPENDENT_AMBULATORY_CARE_PROVIDER_SITE_OTHER): Payer: BLUE CROSS/BLUE SHIELD | Admitting: Orthopaedic Surgery

## 2016-09-28 DIAGNOSIS — Z96651 Presence of right artificial knee joint: Secondary | ICD-10-CM

## 2016-09-28 MED ORDER — OXYCODONE-ACETAMINOPHEN 5-325 MG PO TABS: 1.0000 | ORAL_TABLET | ORAL | 0 refills | Status: AC | PRN

## 2016-09-28 NOTE — Progress Notes (Signed)
Mr. balance returns today 2 weeks status post right total knee arthroplasty. He overall is doing well is ambulating with a cane. States his range of motion strength are improving. He's been also brought her for DVT prophylaxis. Had no shortness breath chest pain Pain.  Physical exam right knee has full extension flexion and 95-100 easily. Calf supple nontender. Surgical incisions healing well.  Plan: He'll finish his Xarelto.Marland Kitchen He is given this prescription to transition outpatient physical therapy. Refill on his Percocets given today. Scar tissue mobilization encouraged. Follow-up with [place] in 1 month check his progress lack of.

## 2016-10-06 ENCOUNTER — Telehealth (INDEPENDENT_AMBULATORY_CARE_PROVIDER_SITE_OTHER): Payer: Self-pay | Admitting: Orthopaedic Surgery

## 2016-10-09 NOTE — Telephone Encounter (Signed)
ERROR

## 2016-10-10 ENCOUNTER — Ambulatory Visit (INDEPENDENT_AMBULATORY_CARE_PROVIDER_SITE_OTHER): Payer: BLUE CROSS/BLUE SHIELD | Admitting: Orthopaedic Surgery

## 2016-10-10 DIAGNOSIS — Z96651 Presence of right artificial knee joint: Secondary | ICD-10-CM

## 2016-10-10 NOTE — Progress Notes (Signed)
The patient is a 51 year old who is now about 3 weeks out from a total knee replacement his right knee. The physical therapist won't take a look at him today continued increase swelling in his knee but that his subsided. He feels well overall and is walking without assistive device.  On examination there is moderate swelling to be expected of his right knee but is got good range of motion of it and it feels ligamentously stable. There is no evidence infection. There is no redness incision skilled nicely. Note he is significantly large individual but seems to be doing well.  Actually slow down a little bit because he is incredibly active. Certainly I would recommend a knee brace at this standpoint but on watch him closely. We see him back at his next visit in June I would like an AP lateral and sunrise view of the right knee.

## 2016-10-20 ENCOUNTER — Other Ambulatory Visit (INDEPENDENT_AMBULATORY_CARE_PROVIDER_SITE_OTHER): Payer: Self-pay | Admitting: Orthopaedic Surgery

## 2016-10-24 NOTE — Telephone Encounter (Signed)
Please advise 

## 2016-10-30 ENCOUNTER — Encounter (INDEPENDENT_AMBULATORY_CARE_PROVIDER_SITE_OTHER): Payer: Self-pay | Admitting: Physician Assistant

## 2016-10-30 ENCOUNTER — Ambulatory Visit (INDEPENDENT_AMBULATORY_CARE_PROVIDER_SITE_OTHER): Payer: BLUE CROSS/BLUE SHIELD | Admitting: Physician Assistant

## 2016-10-30 DIAGNOSIS — M25562 Pain in left knee: Secondary | ICD-10-CM | POA: Insufficient documentation

## 2016-10-30 DIAGNOSIS — Z96651 Presence of right artificial knee joint: Secondary | ICD-10-CM

## 2016-10-30 NOTE — Progress Notes (Signed)
Mr. Buscemi returns today follow-up status post right total knee arthroplasty. Overall doing very well. He states he is only taking naproxen for pain. Has some stiffness and soreness at times but overall is doing very well. Denies shortness breath, fevers chills. States that his left knee pops he is thinking about having knee replacement done on this side.  Physical exam right knee has full extension flexion to at least 115. No instability valgus varus stressing surgical incisions well-healed. Right calf supple nontender. Left knee has good range of motion of the knee no significant crepitus with passive range of motion. No tenderness along the lateral joint line.  Plan: He continue work on strengthening both knees. I did go over his radiographs of his left knee with and this point in time he has no significant arthritis in the knee holder recommended strengthening. May be occasional cortisone injection. We'll see him back in October to check both knees see how he is doing.

## 2016-12-02 ENCOUNTER — Other Ambulatory Visit (INDEPENDENT_AMBULATORY_CARE_PROVIDER_SITE_OTHER): Payer: Self-pay | Admitting: Orthopaedic Surgery

## 2016-12-04 NOTE — Telephone Encounter (Signed)
Please advise Continue?

## 2017-01-17 ENCOUNTER — Encounter: Payer: Self-pay | Admitting: Gastroenterology

## 2017-02-06 ENCOUNTER — Ambulatory Visit (AMBULATORY_SURGERY_CENTER): Payer: Self-pay

## 2017-02-06 VITALS — Ht 72.0 in | Wt 333.6 lb

## 2017-02-06 DIAGNOSIS — Z1211 Encounter for screening for malignant neoplasm of colon: Secondary | ICD-10-CM

## 2017-02-06 MED ORDER — NA SULFATE-K SULFATE-MG SULF 17.5-3.13-1.6 GM/177ML PO SOLN
1.0000 | Freq: Once | ORAL | 0 refills | Status: AC
Start: 2017-02-06 — End: 2017-02-06

## 2017-02-06 NOTE — Progress Notes (Signed)
Denies allergies to eggs or soy products. Denies complication of anesthesia or sedation. Denies use of weight loss medication. Denies use of O2.   Emmi instructions given for colonoscopy.  

## 2017-02-07 ENCOUNTER — Ambulatory Visit (INDEPENDENT_AMBULATORY_CARE_PROVIDER_SITE_OTHER): Payer: BLUE CROSS/BLUE SHIELD | Admitting: Neurology

## 2017-02-07 ENCOUNTER — Encounter: Payer: Self-pay | Admitting: Neurology

## 2017-02-07 VITALS — BP 156/88 | HR 99 | Ht 72.0 in | Wt 331.0 lb

## 2017-02-07 DIAGNOSIS — R42 Dizziness and giddiness: Secondary | ICD-10-CM

## 2017-02-07 DIAGNOSIS — R519 Headache, unspecified: Secondary | ICD-10-CM

## 2017-02-07 DIAGNOSIS — R51 Headache: Secondary | ICD-10-CM | POA: Diagnosis not present

## 2017-02-07 NOTE — Patient Instructions (Addendum)
Please remember, common headache triggers are: sleep deprivation, dehydration, overheating, stress, hypoglycemia or skipping meals and blood sugar fluctuations, excessive pain medications or excessive alcohol use or caffeine withdrawal. Some people have food triggers such as aged cheese, orange juice or chocolate, especially dark chocolate, or MSG (monosodium glutamate). Try to avoid these headache triggers as much possible. It may be helpful to keep a headache diary to figure out what makes your headaches worse or brings them on and what alleviates them. Some people report headache onset after exercise but studies have shown that regular exercise may actually prevent headaches from coming. If you have exercise-induced headaches, please make sure that you drink plenty of fluid before and after exercising and that you do not over do it and do not overheat.  For your dizzy spells, I would like for you to stay really well hydrated with water.   I would suggest no new medication. Use tylenol and/or Motrin as needed. We may consider prevention medication for headaches.   Your neurological exam is normal. You did have a mild drop in your systolic blood pressure. Please talk to Dr. Laney Pastor about blood pressure management. Also, talk to Dr. Laney Pastor about seeing a spine specialist since your back specialist moved away.   Please continue to use your AutoPAP.   We will review your records from your hospital stay in [place].   Please limit your soda intake to up to one per day.   Try to hydrate well with water.
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
## 2017-02-15 ENCOUNTER — Encounter: Payer: Self-pay | Admitting: Gastroenterology

## 2017-02-20 ENCOUNTER — Ambulatory Visit (AMBULATORY_SURGERY_CENTER): Payer: BLUE CROSS/BLUE SHIELD | Admitting: Gastroenterology

## 2017-02-20 ENCOUNTER — Encounter: Payer: Self-pay | Admitting: Gastroenterology

## 2017-02-20 VITALS — BP 105/68 | HR 65 | Temp 98.6°F | Resp 10 | Ht 72.0 in | Wt 331.0 lb

## 2017-02-20 DIAGNOSIS — D128 Benign neoplasm of rectum: Secondary | ICD-10-CM

## 2017-02-20 DIAGNOSIS — Z1211 Encounter for screening for malignant neoplasm of colon: Secondary | ICD-10-CM | POA: Diagnosis present

## 2017-02-20 DIAGNOSIS — K621 Rectal polyp: Secondary | ICD-10-CM | POA: Diagnosis not present

## 2017-02-20 DIAGNOSIS — D129 Benign neoplasm of anus and anal canal: Secondary | ICD-10-CM

## 2017-02-20 MED ORDER — SODIUM CHLORIDE 0.9 % IV SOLN: 500.0000 mL | INTRAVENOUS | Status: AC

## 2017-02-20 NOTE — Progress Notes (Signed)
Called to room to assist during endoscopic procedure.  Patient ID and intended procedure confirmed with present staff. Received instructions for my participation in the procedure from the performing physician.  

## 2017-02-20 NOTE — Op Note (Signed)
[place] Patient Name: Jeremy Vasquez Procedure Date: 02/20/2017 1:52 PM MRN: [id number] Endoscopist: Mallie Mussel L. Loletha Carrow , MD Age: 51 Referring MD:  Date of Birth: March 09, 1966 Gender: Male Account #: [id number] Procedure:                Colonoscopy Indications:              Screening for colorectal malignant neoplasm, This                            is the patient's first colonoscopy Medicines:                Monitored Anesthesia Care Procedure:                Pre-Anesthesia Assessment:                           - Prior to the procedure, a History and Physical                            was performed, and patient medications and                            allergies were reviewed. The patient's tolerance of                            previous anesthesia was also reviewed. The risks                            and benefits of the procedure and the sedation                            options and risks were discussed with the patient.                            All questions were answered, and informed consent                            was obtained. Prior Anticoagulants: The patient has                            taken no previous anticoagulant or antiplatelet                            agents. ASA Grade Assessment: III - A patient with                            severe systemic disease. After reviewing the risks                            and benefits, the patient was deemed in                            satisfactory condition to undergo the procedure.  After obtaining informed consent, the colonoscope                            was passed under direct vision. Throughout the                            procedure, the patient's blood pressure, pulse, and                            oxygen saturations were monitored continuously. The                            Colonoscope was introduced through the anus and                            advanced to the the cecum,  identified by                            appendiceal orifice and ileocecal valve. The                            colonoscopy was performed without difficulty. The                            patient tolerated the procedure well. The quality                            of the bowel preparation was excellent. The                            ileocecal valve, appendiceal orifice, and rectum                            were photographed. The quality of the bowel                            preparation was evaluated using the BBPS [facility]                            Bowel Preparation Scale) with scores of: Right                            Colon = 3, Transverse Colon = 3 and Left Colon = 3.                            The total BBPS score equals 9. The bowel                            preparation used was SUPREP. Scope In: 1:58:01 PM Scope Out: 2:11:46 PM Scope Withdrawal Time: 0 hours 9 minutes 49 seconds  Total Procedure Duration: 0 hours 13 minutes 45 seconds  Findings:                 The perianal and digital rectal  examinations were                            normal.                           Multiple diverticula were found in the left colon.                           A 3 mm polyp was found in the rectum. The polyp was                            sessile. The polyp was removed with a piecemeal                            technique using a cold biopsy forceps. Resection                            and retrieval were complete.                           The exam was otherwise without abnormality on                            direct and retroflexion views. Complications:            No immediate complications. Estimated Blood Loss:     Estimated blood loss was minimal. Impression:               - Diverticulosis in the left colon.                           - One 3 mm polyp in the rectum, removed piecemeal                            using a cold biopsy forceps. Resected and retrieved.                            - The examination was otherwise normal on direct                            and retroflexion views. Recommendation:           - Patient has a contact number available for                            emergencies. The signs and symptoms of potential                            delayed complications were discussed with the                            patient. Return to normal activities tomorrow.                            Written discharge instructions were  provided to the                            patient.                           - Resume previous diet.                           - Continue present medications.                           - Await pathology results.                           - Repeat colonoscopy is recommended for                            surveillance. The colonoscopy date will be                            determined after pathology results from today's                            exam become available for review. Henry L. Loletha Carrow, MD 02/20/2017 2:18:17 PM This report has been signed electronically.

## 2017-02-20 NOTE — Progress Notes (Signed)
Spontaneous respirations throughout. VSS. Resting comfortably. To PACU on room air. Report to  RN. 

## 2017-02-20 NOTE — Patient Instructions (Signed)
YOU HAD AN ENDOSCOPIC PROCEDURE TODAY AT THE [facility]:   Refer to the procedure report that was given to you for any specific questions about what was found during the examination.  If the procedure report does not answer your questions, please call your gastroenterologist to clarify.  If you requested that your care partner not be given the details of your procedure findings, then the procedure report has been included in a sealed envelope for you to review at your convenience later.  YOU SHOULD EXPECT: Some feelings of bloating in the abdomen. Passage of more gas than usual.  Walking can help get rid of the air that was put into your GI tract during the procedure and reduce the bloating. If you had a lower endoscopy (such as a colonoscopy or flexible sigmoidoscopy) you may notice spotting of blood in your stool or on the toilet paper. If you underwent a bowel prep for your procedure, you may not have a normal bowel movement for a few days.  Please Note:  You might notice some irritation and congestion in your nose or some drainage.  This is from the oxygen used during your procedure.  There is no need for concern and it should clear up in a day or so.  SYMPTOMS TO REPORT IMMEDIATELY:   Following lower endoscopy (colonoscopy or flexible sigmoidoscopy):  Excessive amounts of blood in the stool  Significant tenderness or worsening of abdominal pains  Swelling of the abdomen that is new, acute  Fever of 100F or higher   For urgent or emergent issues, a gastroenterologist can be reached at any hour by calling [phone].   DIET:  We do recommend a small meal at first, but then you may proceed to your regular diet.  Drink plenty of fluids but you should avoid alcoholic beverages for 24 hours.  ACTIVITY:  You should plan to take it easy for the rest of today and you should NOT DRIVE or use heavy machinery until tomorrow (because of the sedation medicines used during the test).     FOLLOW UP: Our staff will call the number listed on your records the next business day following your procedure to check on you and address any questions or concerns that you may have regarding the information given to you following your procedure. If we do not reach you, we will leave a message.  However, if you are feeling well and you are not experiencing any problems, there is no need to return our call.  We will assume that you have returned to your regular daily activities without incident.  If any biopsies were taken you will be contacted by phone or by letter within the next 1-3 weeks.  Please call us at [phone] if you have not heard about the biopsies in 3 weeks.    SIGNATURES/CONFIDENTIALITY: You and/or your care partner have signed paperwork which will be entered into your electronic medical record.  These signatures attest to the fact that that the information above on your After Visit Summary has been reviewed and is understood.  Full responsibility of the confidentiality of this discharge information lies with you and/or your care-partner.  Read all handouts given to you by your recovery room nurse. 

## 2017-02-21 ENCOUNTER — Telehealth: Payer: Self-pay | Admitting: *Deleted

## 2017-02-21 NOTE — Telephone Encounter (Signed)
Left message on f/u call 

## 2017-02-21 NOTE — Telephone Encounter (Signed)
Message left

## 2017-02-26 ENCOUNTER — Encounter: Payer: Self-pay | Admitting: Gastroenterology

## 2017-03-05 ENCOUNTER — Ambulatory Visit (INDEPENDENT_AMBULATORY_CARE_PROVIDER_SITE_OTHER): Payer: BLUE CROSS/BLUE SHIELD | Admitting: Physician Assistant

## 2017-03-06 ENCOUNTER — Ambulatory Visit: Payer: BLUE CROSS/BLUE SHIELD | Admitting: Neurology

## 2017-05-10 ENCOUNTER — Ambulatory Visit: Payer: BLUE CROSS/BLUE SHIELD | Admitting: Neurology

## 2017-05-10 ENCOUNTER — Telehealth: Payer: Self-pay

## 2017-05-10 NOTE — Telephone Encounter (Signed)
Pt did not show for their appt with Dr. Athar today.  

## 2017-05-17 ENCOUNTER — Encounter: Payer: Self-pay | Admitting: Neurology

## 2018-09-18 IMAGING — DX DG KNEE 1-2V PORT*R*
2 series · 2 of 2 positions shown · non-contrast
Comparison: None

CLINICAL DATA: Status post right total knee arthroplasty.

EXAM:
PORTABLE RIGHT KNEE - 1-2 VIEW

[knee ap]
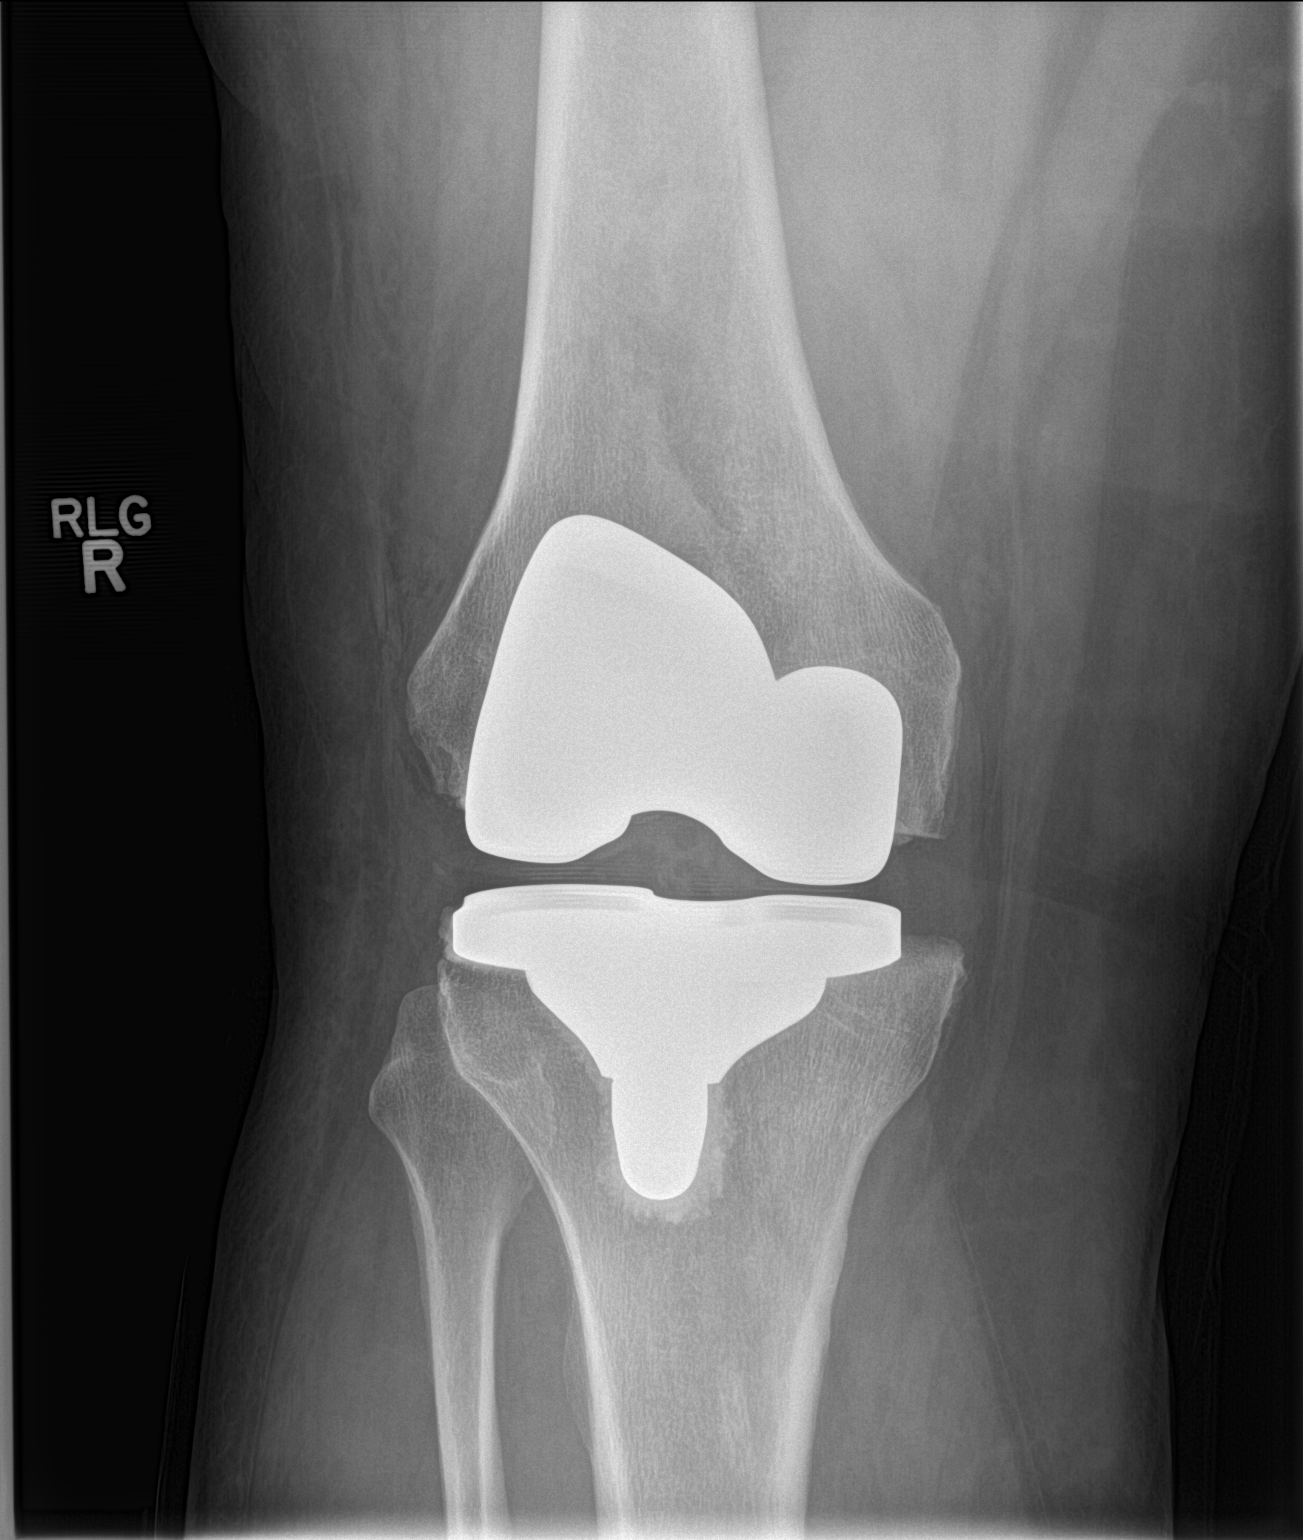

[knee lat]
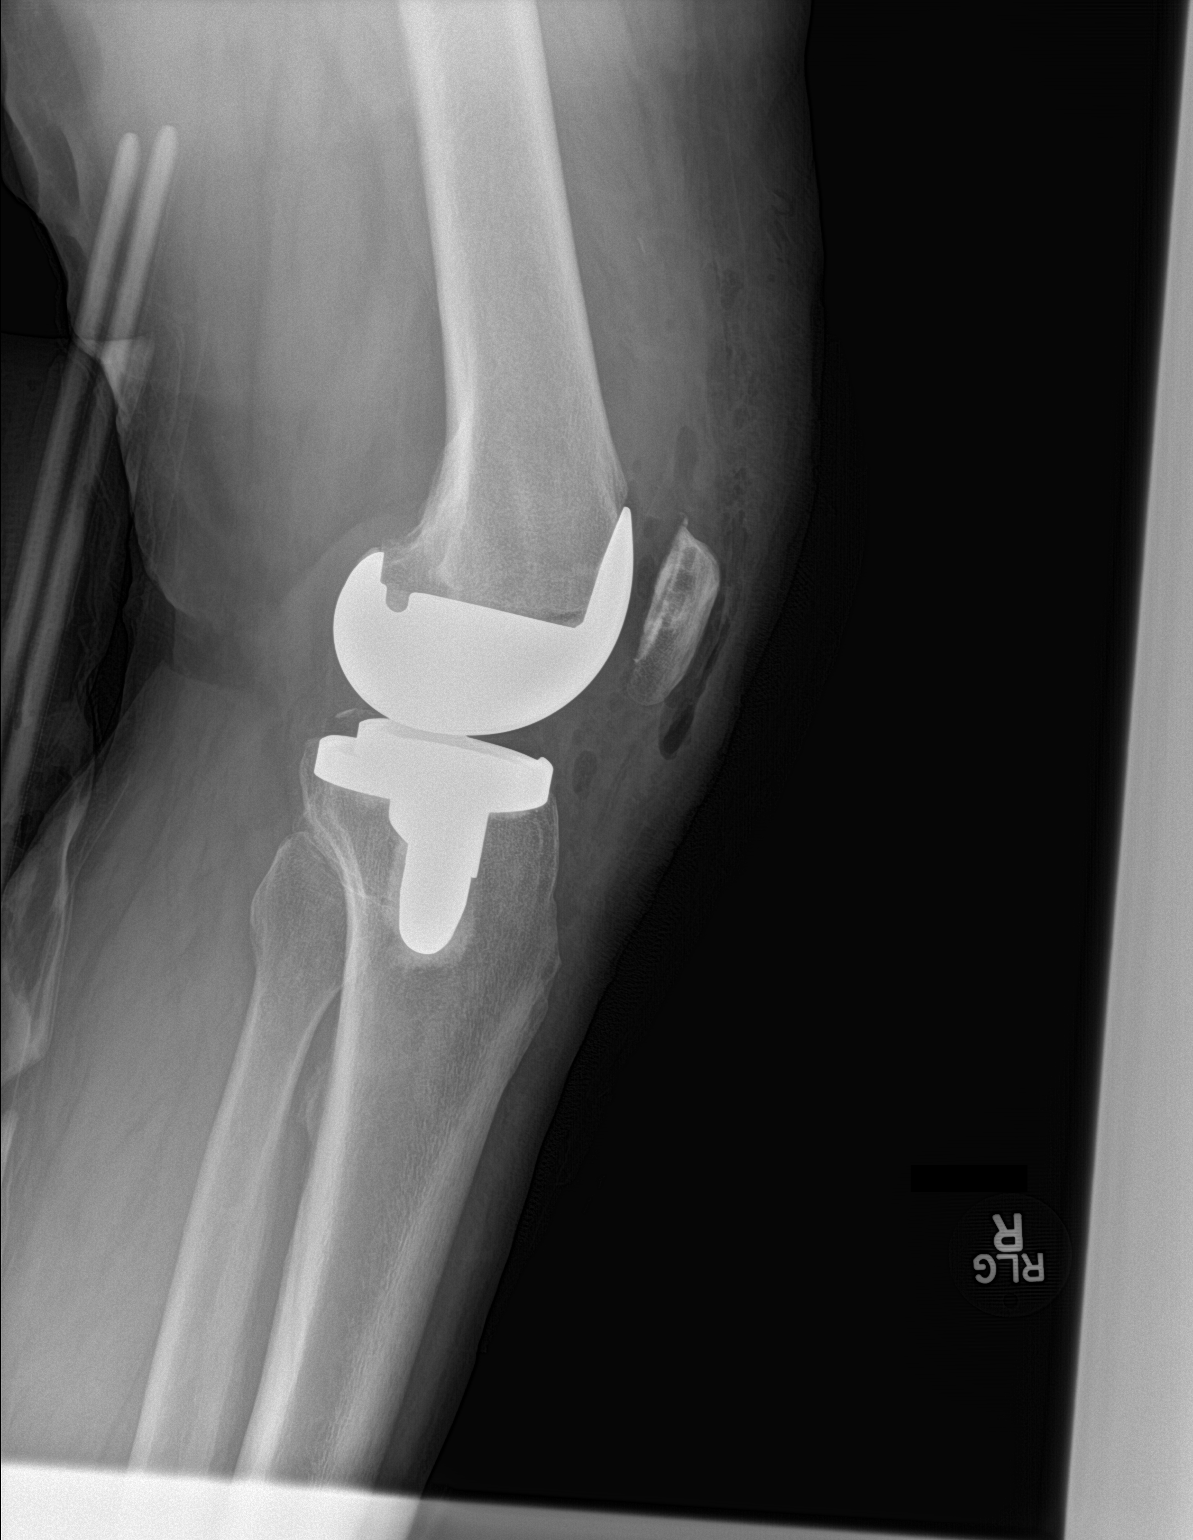

[2 of 2 positions shown; findings below may reference images not displayed]

FINDINGS: The hardware components of a right total knee arthroplasty are
identified. There is no periprosthetic fracture or dislocation. Gas
is identified within the soft tissues along the ventral aspect of
the knee.
IMPRESSION: No complications status post right total knee arthroplasty.
